# Patient Record
Sex: Female | Born: 1960 | Race: Black or African American | Hispanic: No | Marital: Single | Smoking: Never smoker
Health system: Southern US, Community
[De-identification: ages and names within clinical notes are randomized; demographics above are authoritative.]

## PROBLEM LIST (undated history)

## (undated) HISTORY — PX: TONSILLECTOMY: SUR1361

---

## 2012-03-08 ENCOUNTER — Other Ambulatory Visit: Payer: Self-pay | Admitting: Internal Medicine

## 2012-03-08 DIAGNOSIS — Z1231 Encounter for screening mammogram for malignant neoplasm of breast: Secondary | ICD-10-CM

## 2012-03-28 ENCOUNTER — Ambulatory Visit: Payer: Self-pay

## 2012-04-11 ENCOUNTER — Ambulatory Visit
Admission: RE | Admit: 2012-04-11 | Discharge: 2012-04-11 | Disposition: A | Discharge status: Home / Self Care | Payer: Medicaid Other | Source: Ambulatory Visit | Attending: Internal Medicine | Admitting: Internal Medicine

## 2012-04-11 DIAGNOSIS — Z1231 Encounter for screening mammogram for malignant neoplasm of breast: Secondary | ICD-10-CM

## 2014-10-07 ENCOUNTER — Ambulatory Visit: Payer: Self-pay | Admitting: Podiatry

## 2018-11-14 ENCOUNTER — Encounter (HOSPITAL_COMMUNITY): Payer: Self-pay | Admitting: Emergency Medicine

## 2018-11-14 ENCOUNTER — Other Ambulatory Visit: Payer: Self-pay

## 2018-11-14 ENCOUNTER — Emergency Department (HOSPITAL_COMMUNITY)
Admission: EM | Admit: 2018-11-14 | Discharge: 2018-11-14 | Disposition: A | Discharge status: Home / Self Care | Payer: Medicaid Other | Attending: Emergency Medicine | Admitting: Emergency Medicine

## 2018-11-14 ENCOUNTER — Emergency Department (HOSPITAL_COMMUNITY): Payer: Medicaid Other

## 2018-11-14 DIAGNOSIS — Y9389 Activity, other specified: Secondary | ICD-10-CM | POA: Insufficient documentation

## 2018-11-14 DIAGNOSIS — S022XXA Fracture of nasal bones, initial encounter for closed fracture: Secondary | ICD-10-CM | POA: Insufficient documentation

## 2018-11-14 DIAGNOSIS — Y998 Other external cause status: Secondary | ICD-10-CM | POA: Insufficient documentation

## 2018-11-14 DIAGNOSIS — S0121XA Laceration without foreign body of nose, initial encounter: Secondary | ICD-10-CM | POA: Insufficient documentation

## 2018-11-14 DIAGNOSIS — S0990XA Unspecified injury of head, initial encounter: Secondary | ICD-10-CM | POA: Diagnosis present

## 2018-11-14 DIAGNOSIS — R040 Epistaxis: Secondary | ICD-10-CM

## 2018-11-14 DIAGNOSIS — Y92410 Unspecified street and highway as the place of occurrence of the external cause: Secondary | ICD-10-CM | POA: Diagnosis not present

## 2018-11-14 DIAGNOSIS — S0083XA Contusion of other part of head, initial encounter: Secondary | ICD-10-CM | POA: Diagnosis not present

## 2018-11-14 MED ORDER — OXYMETAZOLINE HCL 0.05 % NA SOLN
1.0000 | Freq: Once | NASAL | Status: AC
  Administered 2018-11-14: 1 via NASAL
  Filled 2018-11-14: qty 30

## 2018-11-14 MED ORDER — AMOXICILLIN-POT CLAVULANATE 875-125 MG PO TABS: 1.0000 | ORAL_TABLET | Freq: Two times a day (BID) | ORAL | 0 refills | Status: DC

## 2018-11-14 MED ORDER — LIDOCAINE-EPINEPHRINE-TETRACAINE (LET) SOLUTION
3.0000 mL | Freq: Once | NASAL | Status: AC
  Administered 2018-11-14: 3 mL via TOPICAL
  Filled 2018-11-14: qty 3
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
## 2018-11-14 NOTE — ED Triage Notes (Signed)
Per [employer] transport company was getting a hold of patient's caregiver. Either they or [employer] can transport pt home.
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
## 2018-11-14 NOTE — Discharge Instructions (Addendum)
Use the nasal spray one spray in each nostril once in the morning and once at night for the next 3 days. Follow up with your doctor and follow up with the ENT doctor, Dr. Jenne Pane. Take tylenol and motrin as needed for pain.
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
## 2021-10-16 ENCOUNTER — Ambulatory Visit (HOSPITAL_COMMUNITY)
Admission: EM | Admit: 2021-10-16 | Discharge: 2021-10-16 | Disposition: A | Discharge status: Home / Self Care | Payer: Medicaid Other

## 2021-10-21 ENCOUNTER — Encounter (HOSPITAL_COMMUNITY): Payer: Self-pay | Admitting: Physician Assistant

## 2021-10-21 ENCOUNTER — Telehealth (HOSPITAL_COMMUNITY): Payer: Self-pay | Admitting: *Deleted

## 2021-10-21 ENCOUNTER — Other Ambulatory Visit: Payer: Self-pay

## 2021-10-21 ENCOUNTER — Ambulatory Visit (INDEPENDENT_AMBULATORY_CARE_PROVIDER_SITE_OTHER): Payer: Medicaid Other | Admitting: Physician Assistant

## 2021-10-21 VITALS — BP 138/89 | HR 73 | Wt 147.0 lb

## 2021-10-21 DIAGNOSIS — F209 Schizophrenia, unspecified: Secondary | ICD-10-CM | POA: Diagnosis not present

## 2021-10-21 MED ORDER — ARIPIPRAZOLE 5 MG PO TABS: 5.0000 mg | ORAL_TABLET | Freq: Every day | ORAL | 0 refills | Status: DC

## 2021-10-21 NOTE — Progress Notes (Signed)
Psychiatric Initial Adult Assessment   Patient Identification: Mayley Lish MRN:  [id number] Date of Evaluation:  10/21/2021 Referral Source: [facility] Chief Complaint:   Chief Complaint  Patient presents with   WALK-IN    *wlk-in* new pt [facility] only for med mgmt   Visit Diagnosis:    ICD-10-CM   1. Schizophrenia, unspecified type (HCC)  F20.9 ARIPiprazole (ABILIFY) 5 MG tablet      History of Present Illness:    Leialoha Hanna is a 61 year old female with a past psychiatric history significant for schizophrenia (unspecified type) who presents to [facility] as a walk-in for medication management.  Patient reports that she was referred to our facility through [employer], a mental health and substance abuse treatment service for adult men and women in a short-term residential setting.  Patient presents to the clinic requesting to receive her Abilify Maintena injection.  Patient reports that she received her last injection at [facility].  Prior to presenting today, patient states that she was affiliated with the [facility] for roughly 9 years before the facility closed down.  Patient has been receiving her injection for over a year and states that she receives 300 mg.  Patient reports that her Abilify Maintena injection has been helpful with the management of her symptoms.  Patient endorses anxiety and rates her anxiety a 9 out of 10.  Patient's main stressor is being unable to receive her Abilify Maintena injection.  She reports that she has been without the injection since January.  Prior to receiving her Abilify Maintena injection, patient states that she was taking Risperdal for the management of her symptoms related to schizophrenia.  Patient does not have any documents stating the date in which she received her Abilify Maintena injection.  Patient endorses the following depressive symptoms: fatigue,  lack of motivation, and decreased concentration.  Patient denies feelings of sadness, hopelessness, worthlessness, or feelings of guilt.  Patient further denies bizarre behaviors.  Patient denies a past history of hospitalization due to mental health.  She denies past history of suicide attempt nor has she engaged in self-harm.  A PHQ-9 screen was performed with the patient scoring a 9.  A GAD-7 screen was also performed with the patient scoring a 7.  Patient is alert and oriented x4, calm, cooperative, and engaged in conversation during the encounter.  Patient denies suicidal or homicidal ideations.  She further denies auditory sensations; however, she does report experiencing visual hallucinations characterized by seeing snakes.  Patient endorses good sleep and receives on average 6 to 7 hours of sleep each night.  Patient endorses decreased appetite but states that she eats at least 1 meal per day.  Patient denies alcohol consumption, tobacco use, and illicit drug use.  Associated Signs/Symptoms: Depression Symptoms:  depressed mood, psychomotor agitation, psychomotor retardation, fatigue, difficulty concentrating, impaired memory, anxiety, loss of energy/fatigue, disturbed sleep, weight loss, (Hypo) Manic Symptoms:  Distractibility, Financial Extravagance, Hallucinations, Impulsivity, Anxiety Symptoms:  Excessive Worry, Psychotic Symptoms:  Hallucinations: Visual Paranoia, PTSD Symptoms: Had a traumatic exposure:  Patient denies history of trauma Had a traumatic exposure in the last month:  N/A Re-experiencing:  None Hypervigilance:  Yes Hyperarousal:  Irritability/Anger Avoidance:  Foreshortened Future  Past Psychiatric History:  Schizophrenia  Previous Psychotropic Medications: Yes   Substance Abuse History in the last 12 months:  No.  Consequences of Substance Abuse: Negative  Past Medical History: History reviewed. No pertinent past medical history. History reviewed.  No pertinent surgical history.  Family Psychiatric History:  Patient denies a family history of psychiatric illness  Family History: History reviewed. No pertinent family history.  Social History:   Social History   Socioeconomic History   Marital status: Single    Spouse name: Not on file   Number of children: Not on file   Years of education: Not on file   Highest education level: Not on file  Occupational History   Not on file  Tobacco Use   Smoking status: Never   Smokeless tobacco: Never  Vaping Use   Vaping Use: Never used  Substance and Sexual Activity   Alcohol use: Not on file   Drug use: Not on file   Sexual activity: Not on file  Other Topics Concern   Not on file  Social History Narrative   Not on file   Social Determinants of Health   Financial Resource Strain: Not on file  Food Insecurity: Not on file  Transportation Needs: Not on file  Physical Activity: Not on file  Stress: Not on file  Social Connections: Not on file    Additional Social History:  Patient is currently unemployed but does endorse housing.  Patient also reports that she has transportation.  Patient endorses social support through family.  Patient is affiliated with [employer]  Allergies:  No Known Allergies  Metabolic Disorder Labs: No results found for: HGBA1C, MPG No results found for: PROLACTIN No results found for: CHOL, TRIG, HDL, CHOLHDL, VLDL, LDLCALC No results found for: TSH  Therapeutic Level Labs: No results found for: LITHIUM No results found for: CBMZ No results found for: VALPROATE  Current Medications: Current Outpatient Medications  Medication Sig Dispense Refill   ARIPiprazole (ABILIFY) 5 MG tablet Take 1 tablet (5 mg total) by mouth daily. 14 tablet 0   amoxicillin-clavulanate (AUGMENTIN) 875-125 MG tablet Take 1 tablet by mouth every 12 (twelve) hours. 14 tablet 0   No current facility-administered medications for this visit.     Musculoskeletal: Strength & Muscle Tone: within normal limits Gait & Station: normal Patient leans: N/A  Psychiatric Specialty Exam: Review of Systems  Psychiatric/Behavioral:  Positive for behavioral problems. Negative for decreased concentration, dysphoric mood, hallucinations, self-injury, sleep disturbance and suicidal ideas. The patient is nervous/anxious. The patient is not hyperactive.    Blood pressure 138/89, pulse 73, weight 147 lb (66.7 kg).There is no height or weight on file to calculate BMI.  General Appearance: Fairly Groomed  Eye Contact:  Good  Speech:  Clear and Coherent and Normal Rate  Volume:  Normal  Mood:  Anxious and Depressed  Affect:  Congruent  Thought Process:  Coherent, Goal Directed, and Descriptions of Associations: Intact  Orientation:  Full (Time, Place, and Person)  Thought Content:  WDL and Obsessions  Suicidal Thoughts:  No  Homicidal Thoughts:  No  Memory:  Immediate;   Good Recent;   Fair Remote;   Fair  Judgement:  Impaired  Insight:  Present  Psychomotor Activity:  Normal  Concentration:  Concentration: Good and Attention Span: Good  Recall:  Fair  Fund of Knowledge:Fair  Language: Good  Akathisia:  No  Handed:  Right  AIMS (if indicated):  not done  Assets:  Communication Skills Desire for Improvement Housing Social Support  ADL's:  Intact  Cognition: WNL  Sleep:  Good   Screenings: GAD-7    Flowsheet Row Office Visit from 10/21/2021 in [facility]  Total GAD-7 Score 7  PHQ2-9    Flowsheet Row Office Visit from 10/21/2021 in [facility]  PHQ-2 Total Score 3  PHQ-9 Total Score 9      Flowsheet Row Office Visit from 10/21/2021 in [facility]  C-SSRS RISK CATEGORY No Risk       Assessment and Plan:   Dorothee Napierkowski is a 61 year old female with a past psychiatric history significant for schizophrenia (unspecified type) who  presents to [facility] as a walk-in for medication management.  Went to the clinic requesting to receive her Abilify Maintena 300 mg injection.  She reports that she last received this injection in January at [facility] but she does not have any documentation supporting this claim.  Due to no documents available, patient to be placed on a short course of Abilify 5 mg daily before she is able to receive her Abilify Maintena injection.  Provider to reach out to [facility] to determine when patient received her last injection.  Patient's medication to be e-prescribed to pharmacy of choice.  Collaboration of Care: Medication Management AEB provider is managing patient's psychiatric medication and Psychiatrist AEB patient is being seen by a psychiatric provider at this facility  Patient/Guardian was advised Release of Information must be obtained prior to any record release in order to collaborate their care with an outside provider. Patient/Guardian was advised if they have not already done so to contact the registration department to sign all necessary forms in order for [place] to release information regarding their care.   Consent: Patient/Guardian gives verbal consent for treatment and assignment of benefits for services provided during this visit. Patient/Guardian expressed understanding and agreed to proceed.   1. Schizophrenia, unspecified type [facility]) Patient reports that she was taking Abilify Maintena 300 mg injection and states that she had her last injection back in January 17th, 2022.  Due to not having any evidence of past injection, patient to take 2 weeks of Abilify 5 mg daily before receiving new injection.  Provider to reach out to [facility] to receive confirmation of patient's past history of Abilify injection  - ARIPiprazole (ABILIFY) 5 MG tablet; Take 1 tablet (5 mg total) by mouth daily.  Dispense: 14 tablet;  Refill: 0  Patient to follow up in 2 weeks Provider spent a total of 60+ minutes with the patient/reviewing patient's chart  Meta Hatchet, PA 2/22/20235:39 PM

## 2021-10-21 NOTE — Telephone Encounter (Signed)
Called to say she spoke with [facility] and she had her last Abilify M inj on 09/15/21. Said she was at the pharmacy this am and her medicine didn't get called in. Checked the chart and her oral abilify got called in today at 1115 and informed her of this, she however is very focused on a shot and wants me to call Trinity or Eddie to call, do a release of info and and let Link Snuffer know about her shot date. I told her I would inform him.

## 2021-11-04 ENCOUNTER — Encounter (HOSPITAL_COMMUNITY): Payer: Self-pay | Admitting: Physician Assistant

## 2021-11-04 ENCOUNTER — Other Ambulatory Visit: Payer: Self-pay

## 2021-11-04 ENCOUNTER — Telehealth (HOSPITAL_COMMUNITY): Payer: Self-pay | Admitting: *Deleted

## 2021-11-04 ENCOUNTER — Telehealth (HOSPITAL_COMMUNITY): Payer: Self-pay

## 2021-11-04 ENCOUNTER — Ambulatory Visit (INDEPENDENT_AMBULATORY_CARE_PROVIDER_SITE_OTHER): Payer: Medicaid Other | Admitting: Physician Assistant

## 2021-11-04 VITALS — BP 154/76 | HR 69 | Ht 66.0 in | Wt 145.0 lb

## 2021-11-04 DIAGNOSIS — F209 Schizophrenia, unspecified: Secondary | ICD-10-CM

## 2021-11-04 MED ORDER — ABILIFY MAINTENA 300 MG IM SRER: 300.0000 mg | Freq: Once | INTRAMUSCULAR | 0 refills | Status: DC

## 2021-11-04 NOTE — Progress Notes (Cosign Needed)
BH MD/PA/NP OP Progress Note  11/04/2021 6:54 PM Carol Russell  MRN:  [id number]  Chief Complaint:  Chief Complaint  Patient presents with   Medication Management    in person, f/u mm   HPI:   Carol Russell  Visit Diagnosis:    ICD-10-CM   1. Schizophrenia, unspecified type ([place])  F20.9 ARIPiprazole ER (ABILIFY MAINTENA) 300 MG SRER injection      Past Psychiatric History:  Schizophrenia, unspecified type  Past Medical History: History reviewed. No pertinent past medical history. History reviewed. No pertinent surgical history.  Family Psychiatric History:  Patient denies a family history of psychiatric illness  Family History: History reviewed. No pertinent family history.  Social History:  Social History   Socioeconomic History   Marital status: Single    Spouse name: Not on file   Number of children: Not on file   Years of education: Not on file   Highest education level: Not on file  Occupational History   Not on file  Tobacco Use   Smoking status: Never   Smokeless tobacco: Never  Vaping Use   Vaping Use: Never used  Substance and Sexual Activity   Alcohol use: Not on file   Drug use: Not on file   Sexual activity: Not on file  Other Topics Concern   Not on file  Social History Narrative   Not on file   Social Determinants of Health   Financial Resource Strain: Not on file  Food Insecurity: Not on file  Transportation Needs: Not on file  Physical Activity: Not on file  Stress: Not on file  Social Connections: Not on file    Allergies: No Known Allergies  Metabolic Disorder Labs: No results found for: HGBA1C, MPG No results found for: PROLACTIN No results found for: CHOL, TRIG, HDL, CHOLHDL, VLDL, LDLCALC No results found for: TSH  Therapeutic Level Labs: No results found for: LITHIUM No results found for: VALPROATE No components found for:  CBMZ  Current Medications: Current Outpatient Medications  Medication Sig Dispense Refill    amoxicillin-clavulanate (AUGMENTIN) 875-125 MG tablet Take 1 tablet by mouth every 12 (twelve) hours. 14 tablet 0   ARIPiprazole (ABILIFY) 5 MG tablet Take 1 tablet (5 mg total) by mouth daily. 14 tablet 0   ARIPiprazole ER (ABILIFY MAINTENA) 300 MG SRER injection Inject 1.5 mLs (300 mg total) into the muscle once for 1 dose. 1 each 0   No current facility-administered medications for this visit.     Musculoskeletal: Strength & Muscle Tone: within normal limits Gait & Station: normal Patient leans: N/A  Psychiatric Specialty Exam: Review of Systems  Psychiatric/Behavioral:  Positive for behavioral problems and sleep disturbance. Negative for decreased concentration, dysphoric mood, hallucinations, self-injury and suicidal ideas. The patient is nervous/anxious. The patient is not hyperactive.    Blood pressure (!) 154/76, pulse 69, height 5\' 6"  (1.676 m), weight 145 lb (65.8 kg).Body mass index is 23.4 kg/m.  General Appearance: Fairly Groomed  Eye Contact:  Good  Speech:  Clear and Coherent  Volume:  Normal  Mood:  Anxious and Euthymic  Affect:  Congruent  Thought Process:  Coherent, Goal Directed, and Descriptions of Associations: Intact  Orientation:  Full (Time, Place, and Person)  Thought Content: WDL and Obsessions   Suicidal Thoughts:  No  Homicidal Thoughts:  No  Memory:  Immediate;   Good Recent;   Fair Remote;   Fair  Judgement:  Impaired  Insight:  Present  Psychomotor Activity:  Normal  Concentration:  Concentration: Good and Attention Span: Good  Recall:  [employer] of Knowledge: Fair  Language: Good  Akathisia:  No  Handed:  Right  AIMS (if indicated): not done  Assets:  Communication Skills Desire for Improvement Housing Social Support  ADL's:  Intact  Cognition: WNL  Sleep:  Fair   Screenings: GAD-7    Flowsheet Row Clinical Support from 11/04/2021 in [facility] Office Visit from 10/21/2021 in [facility]  Total GAD-7 Score 8 7      PHQ2-9    Flowsheet Row Clinical Support from 11/04/2021 in [facility] Office Visit from 10/21/2021 in [facility]  PHQ-2 Total Score 2 3  PHQ-9 Total Score 9 9      Flowsheet Row Clinical Support from 11/04/2021 in [facility] Office Visit from 10/21/2021 in [place] No Risk No Risk        Assessment and Plan:     Collaboration of Care: Collaboration of Care: Medication Management AEB provider managing patient's psychiatric medications and Psychiatrist AEB patient being followed by a mental health provider at this clinic  Patient/Guardian was advised Release of Information must be obtained prior to any record release in order to collaborate their care with an outside provider. Patient/Guardian was advised if they have not already done so to contact the registration department to sign all necessary forms in order for [place] to release information regarding their care.   Consent: Patient/Guardian gives verbal consent for treatment and assignment of benefits for services provided during this visit. Patient/Guardian expressed understanding and agreed to proceed.   1. Schizophrenia, unspecified type ([place])  - ARIPiprazole ER (ABILIFY MAINTENA) 300 MG SRER injection; Inject 1.5 mLs (300 mg total) into the muscle once for 1 dose.  Dispense: 1 each; Refill: 0  Provider spent a total of 35 minutes with the patient/reviewing patient's chart Patient to follow up on 11/10/2021 to receive her Abilify Maintena 300 mg injection. Patient to bring LAI to clinic on her appointment date  Malachy Mood, [place] 11/04/2021, 6:54 PM

## 2021-11-04 NOTE — Telephone Encounter (Signed)
Medication management - Attempted to call back Vilinda Blanks, group home owner for patient and left a generic message this nurse was calling her back and would be glad to speak to her.  No current consent so nothing to identify patient acknowledgement left ?

## 2021-11-04 NOTE — Telephone Encounter (Signed)
In for an appt to follow up with [facility]. She is accompanied by a case Production designer, theatre/television/film. Eddie would like her to have a shot of Invega but her dose is 300 mg and we dont stock it. She has MCD so she should pick up her own shot and bring it to clinic to get her injection. She prefers to use [facility] as they deliver but [facility] has it in stock now. However, case manager says she has another appt and cant take her to [facility] for meds and bring her back today for her shot. Eddie PA is to call injection to [facility], it will be delievered to her and she will bring it here on Tues the 14 th and we will give her the shot. Going forward she is to return to [facility]. This was explained to case manager and to patient and written down for her. Shortly after they left [facility] with [employer] called me not understanding what transpired at her appt and what the plan was going forward. Explained I called [facility], she has a current CCA and they do have a shot clinic for LAI and they did recommend at her CCA appt she have medication management services. [facility] does a PSR program she attends and Maxine Glenn agrees she is already in the [facility] system and they can help coordinate her shot, they sent her here because they could not get her seen by a provider at [facility] before her shot was due. She thanked Clinical research associate for the help and agreeing to do a one time bridge of her medicine tues which is the next shot clinic day to give her time to get her shot delievered and to have her case manager make arrangements for [facility] transport to bring her to the appt. Maxine Glenn will coordinate her services going forward to continue her shots and medication services with [facility] in [place].  ?

## 2021-11-09 ENCOUNTER — Ambulatory Visit (HOSPITAL_COMMUNITY): Payer: Medicaid Other

## 2021-11-10 ENCOUNTER — Ambulatory Visit (HOSPITAL_COMMUNITY): Payer: Medicaid Other | Admitting: *Deleted

## 2021-11-10 ENCOUNTER — Encounter (HOSPITAL_COMMUNITY): Payer: Self-pay

## 2021-11-10 ENCOUNTER — Other Ambulatory Visit: Payer: Self-pay

## 2021-11-10 VITALS — BP 129/77 | HR 78 | Ht 66.0 in | Wt 146.0 lb

## 2021-11-10 DIAGNOSIS — F209 Schizophrenia, unspecified: Secondary | ICD-10-CM

## 2021-11-10 MED ORDER — ARIPIPRAZOLE ER 300 MG IM PRSY
300.0000 mg | PREFILLED_SYRINGE | INTRAMUSCULAR | Status: DC
  Administered 2021-11-10 – 2021-12-10 (×2): 300 mg via INTRAMUSCULAR

## 2021-11-10 NOTE — Progress Notes (Signed)
Patient arrived for injection ABILIFY MAINTENA 300 MG . Tolerated injection well in Right-Arm ?

## 2021-11-12 NOTE — Telephone Encounter (Signed)
Patient has been resorbed into this clinic's shot clinic. Patient has a follow up appointment scheduled for 12/10/2021.

## 2021-11-18 ENCOUNTER — Telehealth (HOSPITAL_COMMUNITY): Payer: Self-pay | Admitting: *Deleted

## 2021-11-18 NOTE — Telephone Encounter (Signed)
Call from patient requesting I call her pharmacy so they will deliver her medicine to her. Explained her shot is not due till 12/10/21 and no need for shot to be delivered to her. Explained the provider was not available today, will speak with him re it tomorrow but too early for delivery as shot is not due for three more weeks. She again, repeated would I call her pharmacy so they would deliver her medicine. Will address it with [facility] tomorrow, but no need for it to be delivered this far in advance.  ?

## 2021-11-30 NOTE — Telephone Encounter (Signed)
ABILIFY 300 MG for injection scheduled for 12/11/21. Pt asking if the prescription will be called in so the pharmacy will have time to deliver it to her home before injection appointment.

## 2021-12-01 ENCOUNTER — Telehealth (HOSPITAL_COMMUNITY): Payer: Self-pay | Admitting: Family

## 2021-12-01 NOTE — Telephone Encounter (Signed)
[facility] will deliver to patient.  Patient calling again this morning to make sure Abilify 300 MG is called in so she can get her shot on time. Please call as soon as it is done. [phone]

## 2021-12-01 NOTE — Telephone Encounter (Cosign Needed)
Patient requests refill of Abilify Maintena 300mg  monthly IM. Refills 11. Medication called in to [facility] by this [facility]. Patient notified by clinic RN. ?

## 2021-12-10 ENCOUNTER — Ambulatory Visit (INDEPENDENT_AMBULATORY_CARE_PROVIDER_SITE_OTHER): Payer: Medicaid Other | Admitting: *Deleted

## 2021-12-10 ENCOUNTER — Encounter (HOSPITAL_COMMUNITY): Payer: Self-pay

## 2021-12-10 VITALS — BP 120/71 | HR 60 | Ht 66.0 in | Wt 143.0 lb

## 2021-12-10 DIAGNOSIS — F209 Schizophrenia, unspecified: Secondary | ICD-10-CM | POA: Diagnosis not present

## 2021-12-10 NOTE — Progress Notes (Signed)
PATIENT & [place] WORKER ARRIVED FOR  ?ARIPiprazole ER (ABILIFY MAINTENA) 300 MG ?TOLERATED INJECTION WELL IN LEFT-ARM & PLEASANT AS ALWAYS ?

## 2022-01-07 ENCOUNTER — Ambulatory Visit (HOSPITAL_COMMUNITY): Payer: Medicaid Other

## 2022-01-07 ENCOUNTER — Encounter (HOSPITAL_COMMUNITY): Payer: Self-pay

## 2022-01-07 VITALS — BP 127/71 | HR 61 | Temp 97.9°F | Ht 62.75 in | Wt 141.0 lb

## 2022-01-07 DIAGNOSIS — F209 Schizophrenia, unspecified: Secondary | ICD-10-CM

## 2022-01-07 MED ORDER — ARIPIPRAZOLE ER 300 MG IM PRSY: 300.0000 mg | PREFILLED_SYRINGE | INTRAMUSCULAR | 11 refills | Status: DC

## 2022-01-07 MED ORDER — ARIPIPRAZOLE ER 300 MG IM PRSY
300.0000 mg | PREFILLED_SYRINGE | Freq: Once | INTRAMUSCULAR | Status: AC
  Administered 2022-01-07: 300 mg via INTRAMUSCULAR

## 2022-01-07 NOTE — Progress Notes (Signed)
Patient presented with appropriate affect, level mood and denied any auditory or visual hallucinations, no suicidal or homicidal ideations, and no plans or intent to want to harm self or others. Patient reported no current problems with current medication regimen and stated mood has been good since last visit.  Patient's due Abilify Maintena 300 mg IM injection prepared as ordered and given to patient in her right deltoid area following new order from [employer], NP this date.  Patient tolerated due injection without any complaint of pain or discomfort and agreed to return for next injection in 4 weeks.  ?

## 2022-02-04 ENCOUNTER — Ambulatory Visit (INDEPENDENT_AMBULATORY_CARE_PROVIDER_SITE_OTHER): Payer: Medicaid Other | Admitting: *Deleted

## 2022-02-04 ENCOUNTER — Encounter (HOSPITAL_COMMUNITY): Payer: Self-pay

## 2022-02-04 VITALS — BP 109/76 | HR 69 | Ht 62.0 in | Wt 144.0 lb

## 2022-02-04 DIAGNOSIS — F209 Schizophrenia, unspecified: Secondary | ICD-10-CM

## 2022-02-04 NOTE — Progress Notes (Signed)
Patient arrived for her  (ABILIFY MAINTENA) 300 MG Injection   into the muscle every 28 (twenty-eight) days   Patient tolerated injection well in  Left Arm, Pleasant as Always  NO HI/SI NOR AH/VH

## 2022-03-04 ENCOUNTER — Ambulatory Visit (INDEPENDENT_AMBULATORY_CARE_PROVIDER_SITE_OTHER): Payer: Medicaid Other | Admitting: *Deleted

## 2022-03-04 ENCOUNTER — Encounter (HOSPITAL_COMMUNITY): Payer: Self-pay

## 2022-03-04 VITALS — BP 104/64 | HR 58 | Ht 62.0 in | Wt 140.0 lb

## 2022-03-04 DIAGNOSIS — F209 Schizophrenia, unspecified: Secondary | ICD-10-CM

## 2022-03-04 MED ORDER — ARIPIPRAZOLE ER 300 MG IM PRSY
300.0000 mg | PREFILLED_SYRINGE | INTRAMUSCULAR | Status: DC
  Administered 2022-03-04 – 2022-08-19 (×6): 300 mg via INTRAMUSCULAR

## 2022-03-04 NOTE — Progress Notes (Signed)
Patient arrived for monthly injection: ARIPiprazole ER (ABILIFY MAINTENA) 300 MG  Tolerated injection well in Right Arm, Pleasant as Always.  NO SI/HI NOR VH/AH

## 2022-04-01 ENCOUNTER — Encounter (HOSPITAL_COMMUNITY): Payer: Self-pay

## 2022-04-01 ENCOUNTER — Ambulatory Visit (HOSPITAL_COMMUNITY): Payer: Medicaid Other

## 2022-04-01 ENCOUNTER — Ambulatory Visit (INDEPENDENT_AMBULATORY_CARE_PROVIDER_SITE_OTHER): Payer: Medicaid Other | Admitting: Student in an Organized Health Care Education/Training Program

## 2022-04-01 VITALS — BP 127/58 | HR 69 | Ht 62.0 in | Wt 149.0 lb

## 2022-04-01 DIAGNOSIS — F209 Schizophrenia, unspecified: Secondary | ICD-10-CM | POA: Diagnosis not present

## 2022-04-01 NOTE — Progress Notes (Signed)
BH MD/PA/NP OP Progress Note  04/01/2022 3:01 PM Carol Russell  MRN:  [id number]  Chief Complaint:  Chief Complaint  Patient presents with   Schizophrenia   HPI:   Patient is Aox4. Patient reports she is in a good mood and happy to be alive. Patient reports she is able to take care of her ADLs. Patient reports she lives alone but her payee helps her with her bills. Patient reports that a [facility] worker brought her here today.  Patient reports denies SI, HI, an AVH. Patient denies belief in special in powers, but does endorse some paranoia that "someone" may be out to get her. Patient reports that when she does not get her shot, she starts to feel "nervous. "  Patient reports that she believes she is sleeping well and feels well rested in the morning. Patient reports that her appetite has been good. Patient denies akathisia.   Patient reports she likes to visit people and go shopping. Patient reports she is able to get her groceries and will neighbors every once in a while. Patient reports she has friends at her facility.   4 quarters in $1 11 quarters in : $2.75 5/5 presidents, does have some reverbation on some names   Visit Diagnosis:    ICD-10-CM   1. Schizophrenia, unspecified type ([facility])  F20.9       Past Psychiatric History: Schizophrenia, unspecified type  Past Medical History: History reviewed. No pertinent past medical history. History reviewed. No pertinent surgical history.  Family Psychiatric History: Patient denies a family history of psychiatric illness  Family History: History reviewed. No pertinent family history.  Social History:  Social History   Socioeconomic History   Marital status: Single    Spouse name: Not on file   Number of children: Not on file   Years of education: Not on file   Highest education level: Not on file  Occupational History   Not on file  Tobacco Use   Smoking status: Never   Smokeless tobacco: Never  Vaping Use   Vaping Use: Never  used  Substance and Sexual Activity   Alcohol use: Not Currently   Drug use: Not Currently   Sexual activity: Not Currently  Other Topics Concern   Not on file  Social History Narrative   Not on file   Social Determinants of Health   Financial Resource Strain: Not on file  Food Insecurity: Not on file  Transportation Needs: Not on file  Physical Activity: Not on file  Stress: Not on file  Social Connections: Not on file    Allergies: No Known Allergies  Metabolic Disorder Labs: No results found for: "HGBA1C", "MPG" No results found for: "PROLACTIN" No results found for: "CHOL", "TRIG", "HDL", "CHOLHDL", "VLDL", "LDLCALC" No results found for: "TSH"  Therapeutic Level Labs: No results found for: "LITHIUM" No results found for: "VALPROATE" No results found for: "CBMZ"  Current Medications: Current Outpatient Medications  Medication Sig Dispense Refill   amoxicillin-clavulanate (AUGMENTIN) 875-125 MG tablet Take 1 tablet by mouth every 12 (twelve) hours. 14 tablet 0   ARIPiprazole (ABILIFY) 5 MG tablet Take 1 tablet (5 mg total) by mouth daily. 14 tablet 0   ARIPiprazole ER (ABILIFY MAINTENA) 300 MG PRSY prefilled syringe Inject 300 mg into the muscle every 28 (twenty-eight) days. 1 each 11   Current Facility-Administered Medications  Medication Dose Route Frequency Provider Last Rate Last Admin   ARIPiprazole ER (ABILIFY MAINTENA) 300 MG prefilled syringe 300 mg  300 mg  Intramuscular Q28 days Rankin, Shuvon B, NP   300 mg at 04/01/22 1403     Musculoskeletal: Strength & Muscle Tone: increased Gait & Station: normal Patient leans: Front  Psychiatric Specialty Exam: Review of Systems  Psychiatric/Behavioral:  Negative for hallucinations and suicidal ideas.     Blood pressure (!) 127/58, pulse 69, height 5\' 2"  (1.575 m), weight 149 lb (67.6 kg), SpO2 100 %.Body mass index is 27.25 kg/m.  General Appearance: Casual  Eye Contact:  Good  Speech:  Clear and Coherent   Volume:  Normal  Mood:  Euthymic  Affect:  Congruent  Thought Process:  Goal Directed  Orientation:  Full (Time, Place, and Person)  Thought Content:  Concrete    Suicidal Thoughts:  No  Homicidal Thoughts:  No  Memory:  Immediate;   Poor  Judgement:  Fair  Insight:  Shallow  Psychomotor Activity:  Decreased  Concentration:  Concentration: Good  Recall:  Good  Fund of Knowledge: Fair  Language: Good  Akathisia:  No    AIMS (if indicated): done  Assets:  Resilience Social Support  ADL's:  Intact  Cognition: Impaired,  Moderate  Sleep:  Fair   Screenings: GAD-7    Flowsheet Row Clinical Support from 04/01/2022 in [facility] Clinical Support from 11/04/2021 in [facility] Office Visit from 10/21/2021 in [facility]  Total GAD-7 Score 4 8 7       PHQ2-9    Flowsheet Row Clinical Support from 04/01/2022 in [facility] Clinical Support from 11/04/2021 in [facility] Office Visit from 10/21/2021 in [facility]  PHQ-2 Total Score 1 2 3   PHQ-9 Total Score -- 9 9      Flowsheet Row Clinical Support from 04/01/2022 in [facility] Clinical Support from 11/04/2021 in [facility] Office Visit from 10/21/2021 in [facility]  C-SSRS RISK CATEGORY No Risk No Risk No Risk        Assessment and Plan: Carol Russell is a 61 yo patient with a hx of schizophrenia. Patient endorses a college level education although she denies graduation. Patient was able to do basic calc and recall last 5 president's in order. Patient does display difficult ability to comprehend certain questions noticed during the GAD-7 and PHQ-2. Patient's limited cognitive function for her age is likely related to her schizophrenia diagnosis.   Schizophrenia - Continue  Abilify Maintena 300mg    Collaboration of Care: Collaboration of Care:   Patient/Guardian was advised Release of Information must be obtained prior to any record release in order to collaborate their care with an outside provider. Patient/Guardian was advised if they have not already done so to contact the registration department to sign all necessary forms in order for [facility] to release information regarding their care.   Consent: Patient/Guardian gives verbal consent for treatment and assignment of benefits for services provided during this visit. Patient/Guardian expressed understanding and agreed to proceed.   PGY-3 [name], MD 04/01/2022, 3:01 PM

## 2022-04-29 ENCOUNTER — Other Ambulatory Visit (HOSPITAL_COMMUNITY): Payer: Self-pay | Admitting: Registered Nurse

## 2022-04-29 ENCOUNTER — Encounter (HOSPITAL_COMMUNITY): Payer: Self-pay

## 2022-04-29 ENCOUNTER — Ambulatory Visit (INDEPENDENT_AMBULATORY_CARE_PROVIDER_SITE_OTHER): Payer: Medicaid Other

## 2022-04-29 VITALS — BP 160/83 | HR 64 | Ht 62.0 in | Wt 141.0 lb

## 2022-04-29 DIAGNOSIS — F209 Schizophrenia, unspecified: Secondary | ICD-10-CM

## 2022-04-29 MED ORDER — ARIPIPRAZOLE ER 300 MG IM PRSY: 300.0000 mg | PREFILLED_SYRINGE | INTRAMUSCULAR | 2 refills | Status: DC

## 2022-05-27 ENCOUNTER — Ambulatory Visit (INDEPENDENT_AMBULATORY_CARE_PROVIDER_SITE_OTHER): Payer: Medicaid Other | Admitting: *Deleted

## 2022-05-27 ENCOUNTER — Encounter (HOSPITAL_COMMUNITY): Payer: Self-pay

## 2022-05-27 VITALS — BP 120/92 | HR 85 | Ht 62.0 in | Wt 144.0 lb

## 2022-05-27 DIAGNOSIS — F209 Schizophrenia, unspecified: Secondary | ICD-10-CM

## 2022-05-27 NOTE — Progress Notes (Signed)
Patient arrived today for Injection ARIPiprazole ER (ABILIFY MAINTENA) 300 MG  syringe  Patient pleasant as ALWAYS & Tolerated injection well in Right-Arm

## 2022-06-24 ENCOUNTER — Encounter (HOSPITAL_COMMUNITY): Payer: Self-pay

## 2022-06-24 ENCOUNTER — Ambulatory Visit (INDEPENDENT_AMBULATORY_CARE_PROVIDER_SITE_OTHER): Payer: Medicaid Other | Admitting: *Deleted

## 2022-06-24 VITALS — BP 114/65 | HR 58 | Ht 62.0 in | Wt 145.0 lb

## 2022-06-24 DIAGNOSIS — F209 Schizophrenia, unspecified: Secondary | ICD-10-CM | POA: Diagnosis not present

## 2022-06-24 NOTE — Progress Notes (Signed)
Patient arrived for her Injection - ARIPiprazole ER (ABILIFY MAINTENA) 300 MG EVERY 28 Days. Patient tolerated injection well in Left Arm

## 2022-07-20 ENCOUNTER — Encounter (HOSPITAL_COMMUNITY): Payer: Self-pay

## 2022-07-20 ENCOUNTER — Ambulatory Visit (HOSPITAL_COMMUNITY): Payer: Medicaid Other | Admitting: *Deleted

## 2022-07-20 VITALS — BP 122/79 | HR 69 | Ht 62.0 in | Wt 146.0 lb

## 2022-07-20 DIAGNOSIS — F209 Schizophrenia, unspecified: Secondary | ICD-10-CM

## 2022-07-20 NOTE — Progress Notes (Signed)
Patient arrived for her monthly ARIPiprazole ER (ABILIFY MAINTENA) 300 MG  Patient recognizes me immediately & looks for me to do her checking/rooming. Patient seems to be a little forget yet told me her Thanksgiving meal she's making. Very Pleasant as Always. NO  HI/SI NOR AH /VH .

## 2022-08-19 ENCOUNTER — Encounter (HOSPITAL_COMMUNITY): Payer: Self-pay | Admitting: Registered Nurse

## 2022-08-19 ENCOUNTER — Ambulatory Visit (INDEPENDENT_AMBULATORY_CARE_PROVIDER_SITE_OTHER): Payer: Medicaid Other | Admitting: Registered Nurse

## 2022-08-19 ENCOUNTER — Ambulatory Visit (HOSPITAL_COMMUNITY): Payer: Medicaid Other

## 2022-08-19 ENCOUNTER — Encounter (HOSPITAL_COMMUNITY): Payer: Self-pay

## 2022-08-19 VITALS — BP 128/85 | HR 67 | Ht 62.0 in | Wt 147.8 lb

## 2022-08-19 DIAGNOSIS — F209 Schizophrenia, unspecified: Secondary | ICD-10-CM

## 2022-08-19 MED ORDER — ABILIFY ASIMTUFII 720 MG/2.4ML IM PRSY: 720.0000 mg | PREFILLED_SYRINGE | INTRAMUSCULAR | 2 refills | Status: DC

## 2022-08-19 NOTE — Progress Notes (Signed)
  Patient presents to the office for Abilify Maintena 300 mg injection, given in her left deltoid by Antony Salmon CMA will return in 28 days

## 2022-08-19 NOTE — Progress Notes (Signed)
BH MD/PA/NP OP Progress Note  08/19/2022 2:25 PM Carol Russell  MRN:  [id number]  Chief Complaint:  Chief Complaint  Patient presents with   Follow-up    Psychiatric evaluation and administration of long acting injectable   HPI: Carol Russell 61 y.o. female seen face to face in office today for follow-up psychiatric evaluation and administration of long acting injectable.  She has a psychiatric history significant for schizophrenia.  Her mental health is managed with Abilify Maintena 300 mg monthly.  She reports medication continues to manage her mental health well without adverse reaction but wants to switch over to Abilify Asimtufii (2 month injection).  States she has been doing well with the [facility] and would be more convenient to go to the 2 month injection.  Education on the differences of medications and that once she is started on the injection to make sure she pays attention to her symptoms some do well with 2 month and other with the one month.  Understanding voiced.      Today she denies depression, anxiety, fluctuations in mood, abnormal movement, suicidal/self-harm/homicidal ideation, psychosis, and paranoia.  AIMS, PHQ 2 & 9, C-SSRS, and GAD 7 screenings conducted by provider see scores below. She also reports eating and sleeping without difficulty.   She states that she continues to live alone but has people that are supportive and help her out when needed.    Visit Diagnosis:    ICD-10-CM   1. Schizophrenia, unspecified type (HCC)  F20.9       Past Psychiatric History: Schizophrenia, unspecified type   Past Medical History: History reviewed. No pertinent past medical history. History reviewed. No pertinent surgical history.  Family Psychiatric History: None reported  Family History: History reviewed. No pertinent family history.  Social History:  Social History   Socioeconomic History   Marital status: Single    Spouse name: Not on file   Number of children: Not on  file   Years of education: Not on file   Highest education level: Not on file  Occupational History   Not on file  Tobacco Use   Smoking status: Never   Smokeless tobacco: Never  Vaping Use   Vaping Use: Never used  Substance and Sexual Activity   Alcohol use: Not Currently   Drug use: Not Currently   Sexual activity: Not Currently  Other Topics Concern   Not on file  Social History Narrative   Not on file   Social Determinants of Health   Financial Resource Strain: Not on file  Food Insecurity: Not on file  Transportation Needs: Not on file  Physical Activity: Not on file  Stress: Not on file  Social Connections: Not on file    Allergies: No Known Allergies  Metabolic Disorder Labs: No results found for: "HGBA1C", "MPG" No results found for: "PROLACTIN" No results found for: "CHOL", "TRIG", "HDL", "CHOLHDL", "VLDL", "LDLCALC" No results found for: "TSH"  Therapeutic Level Labs: No results found for: "LITHIUM" No results found for: "VALPROATE" No results found for: "CBMZ"  Current Medications: Current Outpatient Medications  Medication Sig Dispense Refill   ARIPiprazole ER (ABILIFY ASIMTUFII) 720 MG/2.4ML PRSY Inject 720 mg into the muscle every 2 (two) months. 2.4 mL 2   amoxicillin-clavulanate (AUGMENTIN) 875-125 MG tablet Take 1 tablet by mouth every 12 (twelve) hours. 14 tablet 0   Current Facility-Administered Medications  Medication Dose Route Frequency Provider Last Rate Last Admin   ARIPiprazole ER (ABILIFY MAINTENA) 300 MG prefilled syringe 300 mg  300 mg Intramuscular Q28 days Aidan Moten B, NP   300 mg at 08/19/22 1337     Musculoskeletal: Strength & Muscle Tone: within normal limits Gait & Station: normal Patient leans: N/A  Psychiatric Specialty Exam: Review of Systems  Psychiatric/Behavioral:  Negative for agitation, behavioral problems, confusion, dysphoric mood, hallucinations, self-injury and suicidal ideas. The patient is not  nervous/anxious and is not hyperactive.   All other systems reviewed and are negative.   There were no vitals taken for this visit.There is no height or weight on file to calculate BMI.  General Appearance: Casual and Neat  Eye Contact:  Good  Speech:  Clear and Coherent and Normal Rate  Volume:  Normal  Mood:  Euthymic  Affect:  Appropriate and Congruent  Thought Process:  Coherent and Goal Directed  Orientation:  Full (Time, Place, and Person)  Thought Content: WDL and Logical   Suicidal Thoughts:  No  Homicidal Thoughts:  No  Memory:  Immediate;   Good Recent;   Fair Remote;   Fair  Judgement:  Intact  Insight:  Present  Psychomotor Activity:  Normal  Concentration:  Concentration: Good and Attention Span: Good  Recall:  [employer] of Knowledge: Fair  Language: Good  Akathisia:  No  Handed:  Right  AIMS (if indicated): done  Assets:  Communication Skills Desire for Improvement Financial Resources/Insurance Housing Leisure Time Physical Health Social Support  ADL's:  Intact  Cognition: WNL  Sleep:  Good   Screenings: AIMS    Flowsheet Row Office Visit from 08/19/2022 in [facility]  AIMS Total Score 0      GAD-7    Flowsheet Row Clinical Support from 04/01/2022 in [facility] Clinical Support from 11/04/2021 in [facility] Office Visit from 10/21/2021 in [facility]  Total GAD-7 Score 4 8 7       PHQ2-9    Flowsheet Row Office Visit from 08/19/2022 in [facility] Clinical Support from 04/01/2022 in [facility] Clinical Support from 11/04/2021 in [facility] Office Visit from 10/21/2021 in [facility]  PHQ-2 Total Score 0 1 2 3   PHQ-9 Total Score -- -- 9 9      Flowsheet Row Office Visit from 08/19/2022 in [facility] Clinical Support from 04/01/2022 in [facility] Clinical Support from 11/04/2021 in [facility]  C-SSRS RISK CATEGORY No Risk No Risk No Risk      Assessment and Plan: Richelle Glick appears to be doing well.  She reports that medications are effective and managing her mental health without any adverse reaction.  States that she wants to switch to 2 month Abilify Asimtufii.  Informed will start at next visit.  During visit she is dressed appropriate for weather.  She is sitting upright in chair with no noted distress.  She is alert/oriented x 4, calm/cooperative and mood is congruent with affect.  She spoke in a clear tone at moderate volume, and normal pace, with good eye contact.  Her thought process is coherent and relevant; and there is no indication that she is currently responding to internal/external stimuli, or experiencing delusional thought content.  She denies depression, anxiety, suicidal/self-harm/homicidal ideation, psychosis, paranoia, and fluctuations in mood.  1. Schizophrenia, unspecified type (HCC)  CBC with Differential/Platelet    Comprehensive metabolic panel    Hemoglobin  A1c    Magnesium    TSH Prolactin Lipid panel   Collaboration of Care: Collaboration of Care: Medication Management AEB Medication refill, and adjustments Meds ordered this encounter  Medications   ARIPiprazole ER (ABILIFY ASIMTUFII) 720 MG/2.4ML PRSY    Sig: Inject 720 mg into the muscle every 2 (two) months.    Dispense:  2.4 mL    Refill:  2    Order Specific Question:   Supervising Provider    Answer:   Nelly Rout [[id number]]     Patient/Guardian was advised Release of Information must be obtained prior to any record release in order to collaborate their care with an outside provider. Patient/Guardian was advised if they have not already done so to contact the registration department to sign all necessary forms in order for [place] to  release information regarding their care.   Consent: Patient/Guardian gives verbal consent for treatment and assignment of benefits for services provided during this visit. Patient/Guardian expressed understanding and agreed to proceed.    Cayce Paschal, NP 08/19/2022, 2:25 PM

## 2022-08-19 NOTE — Addendum Note (Signed)
Addended by: Antony Salmon F on: 08/19/2022 01:45 PM   Modules accepted: Orders

## 2022-08-20 LAB — MAGNESIUM: Magnesium: 2.1 mg/dL (ref 1.6–2.3)

## 2022-08-20 LAB — CBC WITH DIFFERENTIAL/PLATELET
Basophils Absolute: 0 10*3/uL (ref 0.0–0.2)
Basos: 1 %
EOS (ABSOLUTE): 0.2 10*3/uL (ref 0.0–0.4)
Eos: 3 %
Hematocrit: 43.4 % (ref 34.0–46.6)
Hemoglobin: 14.2 g/dL (ref 11.1–15.9)
Immature Grans (Abs): 0 10*3/uL (ref 0.0–0.1)
Immature Granulocytes: 0 %
Lymphocytes Absolute: 3.2 10*3/uL — ABNORMAL HIGH (ref 0.7–3.1)
Lymphs: 45 %
MCH: 29.3 pg (ref 26.6–33.0)
MCHC: 32.7 g/dL (ref 31.5–35.7)
MCV: 90 fL (ref 79–97)
Monocytes Absolute: 0.4 10*3/uL (ref 0.1–0.9)
Monocytes: 6 %
Neutrophils Absolute: 3.2 10*3/uL (ref 1.4–7.0)
Neutrophils: 45 %
Platelets: 192 10*3/uL (ref 150–450)
RBC: 4.85 x10E6/uL (ref 3.77–5.28)
RDW: 12.3 % (ref 11.7–15.4)
WBC: 7.1 10*3/uL (ref 3.4–10.8)

## 2022-08-20 LAB — SPECIMEN STATUS REPORT

## 2022-08-20 LAB — COMPREHENSIVE METABOLIC PANEL
ALT: 10 IU/L (ref 0–32)
AST: 17 IU/L (ref 0–40)
Albumin/Globulin Ratio: 1.7 (ref 1.2–2.2)
Albumin: 4.7 g/dL (ref 3.9–4.9)
Alkaline Phosphatase: 99 IU/L (ref 44–121)
BUN/Creatinine Ratio: 13 (ref 12–28)
BUN: 12 mg/dL (ref 8–27)
Bilirubin Total: 0.3 mg/dL (ref 0.0–1.2)
CO2: 23 mmol/L (ref 20–29)
Calcium: 9.3 mg/dL (ref 8.7–10.3)
Chloride: 103 mmol/L (ref 96–106)
Creatinine, Ser: 0.96 mg/dL (ref 0.57–1.00)
Globulin, Total: 2.8 g/dL (ref 1.5–4.5)
Glucose: 91 mg/dL (ref 70–99)
Potassium: 4.9 mmol/L (ref 3.5–5.2)
Sodium: 139 mmol/L (ref 134–144)
Total Protein: 7.5 g/dL (ref 6.0–8.5)
eGFR: 66 mL/min/{1.73_m2} (ref >59)

## 2022-08-20 LAB — PROLACTIN: Prolactin: 4.3 ng/mL (ref 3.6–25.2)

## 2022-08-20 LAB — HEMOGLOBIN A1C
Est. average glucose Bld gHb Est-mCnc: 111 mg/dL
Hgb A1c MFr Bld: 5.5 % (ref 4.8–5.6)

## 2022-08-20 LAB — TSH: TSH: 0.655 u[IU]/mL (ref 0.450–4.500)

## 2022-09-23 ENCOUNTER — Other Ambulatory Visit (HOSPITAL_COMMUNITY): Payer: Self-pay | Admitting: Registered Nurse

## 2022-09-23 ENCOUNTER — Ambulatory Visit (INDEPENDENT_AMBULATORY_CARE_PROVIDER_SITE_OTHER): Payer: Medicaid Other | Admitting: *Deleted

## 2022-09-23 ENCOUNTER — Encounter (HOSPITAL_COMMUNITY): Payer: Self-pay

## 2022-09-23 VITALS — BP 129/68 | HR 67 | Temp 98.3°F | Resp 16 | Ht 62.0 in | Wt 153.4 lb

## 2022-09-23 DIAGNOSIS — F209 Schizophrenia, unspecified: Secondary | ICD-10-CM | POA: Diagnosis not present

## 2022-09-23 MED ORDER — ABILIFY ASIMTUFII 720 MG/2.4ML IM PRSY: 720.0000 mg | PREFILLED_SYRINGE | INTRAMUSCULAR | 2 refills | Status: DC

## 2022-09-23 MED ORDER — ARIPIPRAZOLE ER 720 MG/2.4ML IM PRSY
720.0000 mg | PREFILLED_SYRINGE | Freq: Once | INTRAMUSCULAR | Status: AC
  Administered 2022-09-23: 720 mg via INTRAMUSCULAR

## 2022-09-23 NOTE — Progress Notes (Signed)
Patient arrived for injections of Abilify Asimtufii 720mg . She arrived with her medication from her pharmacy. Denies SI/HI or AV hallucinations. Denies any side effects. Given in Left Upper Outer Quadrant. No issues or complaints. To return in 2 months.

## 2022-11-18 ENCOUNTER — Encounter (HOSPITAL_COMMUNITY): Payer: Self-pay

## 2022-11-18 ENCOUNTER — Ambulatory Visit (HOSPITAL_COMMUNITY): Payer: Medicaid Other

## 2022-11-18 ENCOUNTER — Ambulatory Visit (INDEPENDENT_AMBULATORY_CARE_PROVIDER_SITE_OTHER): Payer: Medicaid Other | Admitting: Psychiatry

## 2022-11-18 VITALS — BP 126/72 | HR 73 | Resp 16 | Ht 62.0 in | Wt 150.6 lb

## 2022-11-18 DIAGNOSIS — F209 Schizophrenia, unspecified: Secondary | ICD-10-CM | POA: Diagnosis not present

## 2022-11-18 MED ORDER — ABILIFY ASIMTUFII 720 MG/2.4ML IM PRSY: 720.0000 mg | PREFILLED_SYRINGE | INTRAMUSCULAR | 3 refills | Status: DC

## 2022-11-18 NOTE — Progress Notes (Unsigned)
Patient arrived for injection of Abilify Asimtufi 720mg . Arrived with a helper that states they forgot to pick up her prescription and thought we might have a sample. No samples were available. They will pick it up and return next week. While she was here she did meet with Dr. Ronne Binning to discuss waking up at night, getting less sleep than previous visits.

## 2022-11-18 NOTE — Progress Notes (Signed)
BH MD/PA/NP OP Progress Note  11/18/2022 4:50 PM Carol Russell  MRN:  [id number]  Chief Complaint: "Im doing okay" Carol Russell TSM provider "She is crossing boundaries"  HPI: 62 year old female seen today for follow-up psychiatric evaluation.  She has a psychiatric history of schizophrenia.  Currently she is managed on Abilify at a symptom 5 720 mg every 2 months.  She notes that her medications are effective in managing her psychiatric conditions.  Today she is well-groomed, pleasant, cooperative, and engaged in conversation.  She informed Probation officer that she is doing well.  She notes at times that she gets lonely.  Provider recommended patient using the [employer] program but patient notes that she is not interested at this time.  Patient was seen with her TSM (transitional management provider) Ms. Carol Russell who notes that recently Ms. Carol Russell has been calling her at 12:00 at night.  Ms. Carol Russell notes that she was concerned about upcoming appointments and wanted to discuss them.  Provider discussed with Ms. Carol Russell boundaries.  She endorsed understanding and notes that she would try not to cross them.    Today she notes her mood, anxiety, depression are well-managed.  Provider conducted a GAD-7 and patient scored a 2.  Provider also conducted PHQ-9 the patient scored a 3.  She endorsed adequate sleep and appetite.  Today she denies SI/HI/VAH, mania or paranoia.  No medication changes made today.  Patient agreeable to continue medications as prescribed.  No other concerns at this time.  Visit Diagnosis: No diagnosis found.  Past Psychiatric History: Schizophrenia  Past Medical History: No past medical history on file. No past surgical history on file.  Family Psychiatric History: Unknown  Family History: No family history on file.  Social History:  Social History   Socioeconomic History   Marital status: Single    Spouse name: Not on file   Number of children: Not on file   Years of  education: Not on file   Highest education level: Not on file  Occupational History   Not on file  Tobacco Use   Smoking status: Never   Smokeless tobacco: Never  Vaping Use   Vaping Use: Never used  Substance and Sexual Activity   Alcohol use: Not Currently   Drug use: Not Currently   Sexual activity: Not Currently  Other Topics Concern   Not on file  Social History Narrative   Not on file   Social Determinants of Health   Financial Resource Strain: Not on file  Food Insecurity: Not on file  Transportation Needs: Not on file  Physical Activity: Not on file  Stress: Not on file  Social Connections: Not on file    Allergies: No Known Allergies  Metabolic Disorder Labs: Lab Results  Component Value Date   HGBA1C 5.5 08/19/2022   Lab Results  Component Value Date   PROLACTIN 4.3 08/19/2022   No results found for: "CHOL", "TRIG", "HDL", "CHOLHDL", "VLDL", "LDLCALC" Lab Results  Component Value Date   TSH 0.655 08/19/2022    Therapeutic Level Labs: No results found for: "LITHIUM" No results found for: "VALPROATE" No results found for: "CBMZ"  Current Medications: Current Outpatient Medications  Medication Sig Dispense Refill   amoxicillin-clavulanate (AUGMENTIN) 875-125 MG tablet Take 1 tablet by mouth every 12 (twelve) hours. 14 tablet 0   ARIPiprazole ER (ABILIFY ASIMTUFII) 720 MG/2.4ML PRSY Inject 720 mg into the muscle every 2 (two) months. 2.4 mL 3   No current facility-administered medications for this visit.  Musculoskeletal: Strength & Muscle Tone: within normal limits Gait & Station: unsteady Patient leans: N/A  Psychiatric Specialty Exam: Review of Systems  There were no vitals taken for this visit.There is no height or weight on file to calculate BMI.  General Appearance: Well Groomed  Eye Contact:  Good  Speech:  Clear and Coherent and Normal Rate  Volume:  Normal  Mood:  Euthymic  Affect:  Appropriate and Congruent  Thought Process:   Coherent, Goal Directed, and Linear  Orientation:  Full (Time, Place, and Person)  Thought Content: WDL and Logical   Suicidal Thoughts:  No  Homicidal Thoughts:  No  Memory:  Immediate;   Good Recent;   Good Remote;   Good  Judgement:  Good  Insight:  Good  Psychomotor Activity:  Normal  Concentration:  Concentration: Good and Attention Span: Good  Recall:  Good  Fund of Knowledge: Good  Language: Good  Akathisia:  No  Handed:  Right  AIMS (if indicated): not done  Assets:  Communication Skills Desire for Improvement Financial Resources/Insurance Housing Leisure Time Physical Health Social Support  ADL's:  Intact  Cognition: WNL  Sleep:  Good   Screenings: [place] Office Visit from 11/18/2022 in [facility] Office Visit from 08/19/2022 in [place] Total Score 0 0      GAD-7    [place] Office Visit from 11/18/2022 in [place] from 04/01/2022 in [employer] from 11/04/2021 in [facility] Office Visit from 10/21/2021 in [facility]  Total GAD-7 Score 2 4 8 7       PHQ2-9    [place] Office Visit from 11/18/2022 in [facility] Office Visit from 08/19/2022 in [place] from 04/01/2022 in [place] from 11/04/2021 in [facility] Office Visit from 10/21/2021 in [facility]  PHQ-2 Total Score 0 0 1 2 3   PHQ-9 Total Score 3 -- -- 9 9      [place] Visit from 08/19/2022 in [place] from 04/01/2022 in [place] from 11/04/2021 in [place] No Risk No Risk No Risk        Assessment and Plan: Patient reports at times that she is lonely but reports that she is trying to cope with it.  At times she reaches out to her TSM coordinator late at night but reports that she will attempt to not do this anymore.  She finds the medications effective.  No medication changes made today.  Patient agreeable to continue medication as prescribed.  1. Schizophrenia, unspecified type ([place])  Continue- ARIPiprazole ER (ABILIFY ASIMTUFII) 720 MG/2.4ML PRSY; Inject 720 mg into the muscle every 2 (two) months.  Dispense: 2.4 mL; Refill: 3    Collaboration of Care: Collaboration of Care: Other provider involved in patient's care [place] [facility] staff and PCP   Patient/Guardian was advised Release of Information must be obtained prior to any record release in order to collaborate their care with an outside provider. Patient/Guardian was advised if they have not already done so to contact the registration department to sign all necessary forms in order for [place] to release information regarding their  care.   Consent: Patient/Guardian gives verbal consent for treatment and assignment of benefits for services provided during this visit. Patient/Guardian expressed understanding and agreed to proceed.   Follow up in 3 months for medication management Follow up with shot clinic staff Salley Slaughter, NP 11/18/2022, 4:50 PM

## 2022-11-23 ENCOUNTER — Ambulatory Visit (HOSPITAL_COMMUNITY): Payer: Medicaid Other

## 2022-11-23 ENCOUNTER — Encounter (HOSPITAL_COMMUNITY): Payer: Self-pay

## 2022-11-23 VITALS — BP 131/62 | HR 72 | Resp 16 | Ht 62.0 in | Wt 154.2 lb

## 2022-11-23 DIAGNOSIS — F209 Schizophrenia, unspecified: Secondary | ICD-10-CM

## 2022-11-23 MED ORDER — ARIPIPRAZOLE ER 720 MG/2.4ML IM PRSY
720.0000 mg | PREFILLED_SYRINGE | Freq: Once | INTRAMUSCULAR | Status: AC
  Administered 2022-11-23: 720 mg via INTRAMUSCULAR

## 2022-11-23 NOTE — Progress Notes (Signed)
Patient arrived with Abilify Asimtufii 720mg  to be given. Pleasant, cooperative, smiling with bright affect. Had a lot of questions about her bloodwork from December asking about being a prediabetic and what she can drink. Also asking if we could help her financially with her dental work that needs done. She said she will need $3000. Explained that we are unable to help with dental and explained the services we provide. Injection given in Right Upper Outer Quadrant without issues or complaints. Denies SI/HI or AV hallucinations.

## 2023-01-27 ENCOUNTER — Encounter (HOSPITAL_COMMUNITY): Payer: Self-pay

## 2023-01-27 ENCOUNTER — Ambulatory Visit (INDEPENDENT_AMBULATORY_CARE_PROVIDER_SITE_OTHER): Payer: Medicaid Other | Admitting: *Deleted

## 2023-01-27 VITALS — BP 127/78 | HR 63 | Resp 16 | Ht 62.0 in | Wt 148.6 lb

## 2023-01-27 DIAGNOSIS — F209 Schizophrenia, unspecified: Secondary | ICD-10-CM | POA: Diagnosis not present

## 2023-01-27 MED ORDER — ARIPIPRAZOLE ER 720 MG/2.4ML IM PRSY
720.0000 mg | PREFILLED_SYRINGE | Freq: Once | INTRAMUSCULAR | Status: AC
  Administered 2023-01-27: 720 mg via INTRAMUSCULAR

## 2023-01-27 NOTE — Progress Notes (Cosign Needed)
Patient arrived with her injection of Abilify Asimtufii 720mg . Given in Left upper Outer Quadrant without issues or complaint. Denies SI/HI or AV hallucinations. States the medication is doing well for her. Pleasant and smiling making small talk.

## 2023-02-17 ENCOUNTER — Ambulatory Visit (HOSPITAL_COMMUNITY): Payer: Medicaid Other

## 2023-03-31 ENCOUNTER — Encounter (HOSPITAL_COMMUNITY): Payer: Self-pay

## 2023-03-31 ENCOUNTER — Ambulatory Visit (INDEPENDENT_AMBULATORY_CARE_PROVIDER_SITE_OTHER): Payer: Medicaid Other | Admitting: Student

## 2023-03-31 ENCOUNTER — Ambulatory Visit (INDEPENDENT_AMBULATORY_CARE_PROVIDER_SITE_OTHER): Payer: MEDICAID | Admitting: *Deleted

## 2023-03-31 VITALS — BP 113/70 | HR 81 | Ht 62.0 in | Wt 148.2 lb

## 2023-03-31 DIAGNOSIS — Z79899 Other long term (current) drug therapy: Secondary | ICD-10-CM | POA: Diagnosis not present

## 2023-03-31 DIAGNOSIS — K5903 Drug induced constipation: Secondary | ICD-10-CM | POA: Diagnosis not present

## 2023-03-31 DIAGNOSIS — F209 Schizophrenia, unspecified: Secondary | ICD-10-CM | POA: Diagnosis not present

## 2023-03-31 MED ORDER — ARIPIPRAZOLE ER 720 MG/2.4ML IM PRSY
720.0000 mg | PREFILLED_SYRINGE | Freq: Once | INTRAMUSCULAR | Status: AC
  Administered 2023-03-31: 720 mg via INTRAMUSCULAR

## 2023-03-31 NOTE — Progress Notes (Signed)
BH MD/PA/NP OP Progress Note  03/31/2023 1:16 PM Carol Russell  MRN:  [id number]  Chief Complaint: "Im doing good"  HPI: 62 year old female seen today for follow-up psychiatric evaluation.  She has a psychiatric history of schizophrenia.  Currently she is managed on Abilify Asimtufii 720 mg every 2 months.  She notes that her medications are effective in managing her psychiatric conditions.  Today she is well-groomed, pleasant, cooperative, and engaged in conversation.  She informed Clinical research associate that she is doing well.  She has been Carol Russell notes that she was concerned about upcoming appointments and wanted to discuss them.  Provider discussed with Carol Russell boundaries.  She endorsed understanding and notes that she would try not to cross them.   Today she notes her mood, anxiety, depression are well-managed.  Provider conducted a GAD-7 and patient scored a 0.  Provider also conducted PHQ-2 the patient scored a 0.  She endorsed adequate sleep and appetite.  Today she denies SI/HI/VAH, mania or paranoia.  Her only somatic concern today is that she is not having regular BM, but she has not paid attention to the interval in which her BM are occurring.   No medication changes made today.  Patient agreeable to continue medications as prescribed.  No other concerns at this time.  Visit Diagnosis:    ICD-10-CM   1. Schizophrenia, unspecified type (HCC)  F20.9       Past Psychiatric History: Schizophrenia  Past Medical History: No past medical history on file. No past surgical history on file.  Family Psychiatric History: Unknown  Family History: No family history on file.  Social History:  Social History   Socioeconomic History   Marital status: Single    Spouse name: Not on file   Number of children: Not on file   Years of education: Not on file   Highest education level: Not on file  Occupational History   Not on file  Tobacco Use   Smoking status: Never   Smokeless tobacco: Never   Vaping Use   Vaping status: Never Used  Substance and Sexual Activity   Alcohol use: Not Currently   Drug use: Not Currently   Sexual activity: Not Currently  Other Topics Concern   Not on file  Social History Narrative   Not on file   Social Determinants of Health   Financial Resource Strain: Not on file  Food Insecurity: No Food Insecurity (01/01/2022)   Received from [facility]   Hunger Vital Sign    Worried About Running Out of Food in the Last Year: Never true    Ran Out of Food in the Last Year: Never true  Transportation Needs: Not on file  Physical Activity: Not on file  Stress: Not on file  Social Connections: Unknown (12/28/2021)   [facility]   Social Network    Social Network: Not on file    Allergies: No Known Allergies  Metabolic Disorder Labs: Lab Results  Component Value Date   HGBA1C 5.5 08/19/2022   Lab Results  Component Value Date   PROLACTIN 4.3 08/19/2022   No results found for: "CHOL", "TRIG", "HDL", "CHOLHDL", "VLDL", "LDLCALC" Lab Results  Component Value Date   TSH 0.655 08/19/2022    Therapeutic Level Labs: No results found for: "LITHIUM" No results found for: "VALPROATE" No results found for: "CBMZ"  Current Medications: Current Outpatient Medications  Medication Sig Dispense Refill   amoxicillin-clavulanate (AUGMENTIN) 875-125 MG tablet Take 1 tablet  by mouth every 12 (twelve) hours. 14 tablet 0   ARIPiprazole ER (ABILIFY ASIMTUFII) 720 MG/2.4ML PRSY Inject 720 mg into the muscle every 2 (two) months. 2.4 mL 3   No current facility-administered medications for this visit.     Musculoskeletal: Strength & Muscle Tone: within normal limits Gait & Station: unsteady Patient leans: N/A  Psychiatric Specialty Exam: Review of Systems  Constitutional:  Negative for activity change and appetite change.  Cardiovascular:  Negative for chest pain.  Gastrointestinal:  Positive for  constipation. Negative for abdominal pain and diarrhea.       Possible constipation?   Neurological:  Negative for headaches.    There were no vitals taken for this visit.There is no height or weight on file to calculate BMI.  General Appearance: Well Groomed  Eye Contact:  Good  Speech:  Clear and Coherent and Normal Rate  Volume:  Normal  Mood:  Euthymic  Affect:  Restricted  Thought Process:  Coherent, Goal Directed, and Linear  Orientation:  Full (Time, Place, and Person)  Thought Content: WDL and Logical   Suicidal Thoughts:  No  Homicidal Thoughts:  No  Memory:  Immediate;   Good Recent;   Good Remote;   Good  Judgement:  Good  Insight:  Good  Psychomotor Activity:  Normal  Concentration:  Concentration: Good and Attention Span: Good  Recall:  Good  Fund of Knowledge: Fair  Language: Good  Akathisia:  No  Handed:  Right  AIMS (if indicated): 0  Assets:  Communication Skills Desire for Improvement Financial Resources/Insurance Housing Leisure Time Physical Health Social Support  ADL's:  Intact  Cognition: Impaired,  Mild and Moderate, appears baseline  Sleep:  Good   Screenings: AIMS    Flowsheet Row Office Visit from 11/18/2022 in [facility] Office Visit from 08/19/2022 in [facility]  AIMS Total Score 0 0      GAD-7    Flowsheet Row Office Visit from 11/18/2022 in [facility] Clinical Support from 04/01/2022 in [facility] Clinical Support from 11/04/2021 in [facility] Office Visit from 10/21/2021 in [facility]  Total GAD-7 Score 2 4 8 7       PHQ2-9    Flowsheet Row Office Visit from 11/18/2022 in [facility] Office Visit from 08/19/2022 in [facility] Clinical Support from 04/01/2022 in [facility]  Clinical Support from 11/04/2021 in [facility] Office Visit from 10/21/2021 in [facility]  PHQ-2 Total Score 0 0 1 2 3   PHQ-9 Total Score 3 -- -- 9 9      Flowsheet Row Office Visit from 08/19/2022 in [facility] Clinical Support from 04/01/2022 in [facility] Clinical Support from 11/04/2021 in [facility]  C-SSRS RISK CATEGORY No Risk No Risk No Risk        Assessment and Plan: Patient reports that she has been finding enjoyment in shopping for her wants and needs. She feels safe overall, but is able to call on her neighbors or her church's prayer group if she ever feels unsafe. She is stable on current dose of medication at current interval.  She finds the medications effective.  No medication changes made today.  Patient agreeable to continue medication as prescribed. As patient is only seen every 4 months  in [facility] clinic with different providers, will arrange regular follow-up. Patient voices understanding and agreement.   1. Schizophrenia, unspecified type (HCC)  Continue- ARIPiprazole ER (ABILIFY ASIMTUFII) 720 MG/2.4ML PRSY; Inject 720 mg into the muscle every 2 (two) months.  Dispense: 2.4 mL; Refill: 3    Collaboration of Care: Collaboration of Care: Other provider involved in patient's care [facility] staff and PCP   Patient/Guardian was advised Release of Information must be obtained prior to any record release in order to collaborate their care with an outside provider. Patient/Guardian was advised if they have not already done so to contact the registration department to sign all necessary forms in order for [place] to release information regarding their care.   Consent: Patient/Guardian gives verbal consent for treatment and assignment of benefits for services provided during this visit. Patient/Guardian expressed understanding and agreed to  proceed.   Follow up in 2 months for medication management and with shot clinic staff  Lamar Sprinkles, MD 03/31/2023, 1:16 PM

## 2023-03-31 NOTE — Progress Notes (Cosign Needed)
Patient arrived with her Abilify Asimtufii 720mg  from her pharmacy. Was given in her Right Upper Outer Quadrant. No issues or complaints. Pleasant, smiling, cooperative. Denies any side effects.

## 2023-04-01 DIAGNOSIS — Z79899 Other long term (current) drug therapy: Secondary | ICD-10-CM | POA: Insufficient documentation

## 2023-04-01 DIAGNOSIS — K5903 Drug induced constipation: Secondary | ICD-10-CM | POA: Insufficient documentation

## 2023-05-11 ENCOUNTER — Telehealth (HOSPITAL_COMMUNITY): Payer: Self-pay | Admitting: Student

## 2023-05-18 ENCOUNTER — Telehealth (HOSPITAL_COMMUNITY): Payer: Self-pay | Admitting: Student

## 2023-05-26 ENCOUNTER — Encounter (HOSPITAL_COMMUNITY): Payer: Self-pay

## 2023-05-26 ENCOUNTER — Ambulatory Visit (INDEPENDENT_AMBULATORY_CARE_PROVIDER_SITE_OTHER): Payer: MEDICAID | Admitting: *Deleted

## 2023-05-26 VITALS — BP 124/72 | HR 65 | Resp 16 | Ht 62.0 in | Wt 150.2 lb

## 2023-05-26 DIAGNOSIS — F209 Schizophrenia, unspecified: Secondary | ICD-10-CM | POA: Diagnosis not present

## 2023-05-26 MED ORDER — ARIPIPRAZOLE ER 720 MG/2.4ML IM PRSY
720.0000 mg | PREFILLED_SYRINGE | Freq: Once | INTRAMUSCULAR | Status: AC
  Administered 2023-05-26: 720 mg via INTRAMUSCULAR

## 2023-05-26 NOTE — Progress Notes (Cosign Needed)
Patient arrived with her Abilify Asimtufii 720mg  from her pharmacy. Given in Left Upper Outer Quadrant without issue or complaint. Pleasant, smiling, and cooperative. Will return in 2 months.

## 2023-06-01 ENCOUNTER — Ambulatory Visit (HOSPITAL_COMMUNITY): Payer: MEDICAID

## 2023-06-02 ENCOUNTER — Ambulatory Visit (HOSPITAL_COMMUNITY): Payer: MEDICAID | Admitting: Student

## 2023-06-22 NOTE — Progress Notes (Deleted)
Carol Russell 55 yof Crossing boundaries Patient of [facility] clinic, sent for regular f/u to me by CC DX schiz, on Asimtufii 720 x2 mo C/f AP induced constipation Case manager [place] says she crosses boundaries  No Hx admits in EMR   SE? Why was 720 chosen? Sounds like 960 is the standard initial and maintenance Prev on maintena 8/3, switched 08/19/2022  Plan No EKG, needs EKG Needs labs PCP? Doesn't seem like it

## 2023-06-23 ENCOUNTER — Ambulatory Visit (HOSPITAL_COMMUNITY): Payer: MEDICAID | Admitting: Student

## 2023-07-21 ENCOUNTER — Encounter (HOSPITAL_COMMUNITY): Payer: Self-pay

## 2023-07-21 ENCOUNTER — Ambulatory Visit (HOSPITAL_COMMUNITY): Payer: MEDICAID | Admitting: *Deleted

## 2023-07-21 ENCOUNTER — Ambulatory Visit (INDEPENDENT_AMBULATORY_CARE_PROVIDER_SITE_OTHER): Payer: MEDICAID | Admitting: Student

## 2023-07-21 VITALS — BP 146/86 | HR 68 | Ht 62.0 in | Wt 147.8 lb

## 2023-07-21 DIAGNOSIS — K5903 Drug induced constipation: Secondary | ICD-10-CM

## 2023-07-21 DIAGNOSIS — F209 Schizophrenia, unspecified: Secondary | ICD-10-CM

## 2023-07-21 DIAGNOSIS — Z79899 Other long term (current) drug therapy: Secondary | ICD-10-CM

## 2023-07-21 MED ORDER — ARIPIPRAZOLE ER 720 MG/2.4ML IM PRSY
720.0000 mg | PREFILLED_SYRINGE | Freq: Once | INTRAMUSCULAR | Status: AC
  Administered 2023-07-21: 720 mg via INTRAMUSCULAR

## 2023-07-21 MED ORDER — POLYETHYLENE GLYCOL 3350 17 G PO PACK: 17.0000 g | PACK | Freq: Every day | ORAL | 0 refills | Status: AC

## 2023-07-21 NOTE — Progress Notes (Signed)
 Patient arrived with her injection of Abilify Asimtufii 720mg  from her pharmacy. States her medication is working fine with no issues or complaints. Had her 3 month check with the MD today during clinic. Pleasant, cooperative with flat affect that brightens on approach. Injection given in Right Upper Outer Quadrant. Denies SI/HI or AV hallucinations.

## 2023-07-22 ENCOUNTER — Encounter (HOSPITAL_COMMUNITY): Payer: Self-pay | Admitting: Student

## 2023-07-22 NOTE — Progress Notes (Signed)
BH MD/PA/NP OP Progress Note  07/22/2023 12:18 PM Carol Russell  MRN:  [id number]  Chief Complaint: "Im doing good"  HPI: 62 year old female seen today for follow-up psychiatric evaluation.  She has a psychiatric history of schizophrenia.  Currently she is managed on Abilify Asimtufii 720 mg every 2 months.  She notes that her medications are effective in managing her psychiatric conditions.  Today she is well-groomed, pleasant, cooperative, and engaged in conversation.  She informed Clinical research associate that she is doing well in regards to mood and denies feelings of anxiety. She remains amenable to seeing Dr. Jerrel Ivory as her primary psychiatrist. She denies SI/HI/AVH and has not had a psychotic episode for a few months. She does report some memory problems but states this is chronic.   She does report some concern for constipation today but was amenable to starting miralax daily to aid with her bowel movements.  Patient agreeable to continue medications as prescribed.  No other concerns at this time.  Visit Diagnosis:    ICD-10-CM   1. Schizophrenia, unspecified type (HCC)  F20.9     2. Long term current use of antipsychotic medication  Z79.899     3. Possible antipsychotic induced constipation  K59.03 polyethylene glycol (MIRALAX) 17 g packet      Past Psychiatric History: Schizophrenia  Past Medical History: No past medical history on file. No past surgical history on file.  Family Psychiatric History: Unknown  Family History: No family history on file.  Social History:  Social History   Socioeconomic History   Marital status: Single    Spouse name: Not on file   Number of children: Not on file   Years of education: Not on file   Highest education level: Not on file  Occupational History   Not on file  Tobacco Use   Smoking status: Never   Smokeless tobacco: Never  Vaping Use   Vaping status: Never Used  Substance and Sexual Activity   Alcohol use: Not Currently   Drug use:  Not Currently   Sexual activity: Not Currently  Other Topics Concern   Not on file  Social History Narrative   Not on file   Social Determinants of Health   Financial Resource Strain: Low Risk  (04/11/2023)   Received from [facility]   Overall Financial Resource Strain (CARDIA)    Difficulty of Paying Living Expenses: Not hard at all  Food Insecurity: No Food Insecurity (04/11/2023)   Received from [facility]   Hunger Vital Sign    Worried About Running Out of Food in the Last Year: Never true    Ran Out of Food in the Last Year: Never true  Transportation Needs: No Transportation Needs (04/11/2023)   [facility]    Lack of Transportation (Medical): No    Lack of Transportation (Non-Medical): No  Physical Activity: Not on file  Stress: Not on file  Social Connections: Unknown (12/28/2021)   Received from [facility], [facility]   Social Network    Social Network: Not on file    Allergies: No Known Allergies  Metabolic Disorder Labs: Lab Results  Component Value Date   HGBA1C 5.5 08/19/2022   Lab Results  Component Value Date   PROLACTIN 4.3 08/19/2022   No results found for: "CHOL", "TRIG", "HDL", "CHOLHDL", "VLDL", "LDLCALC" Lab Results  Component Value Date   TSH 0.655 08/19/2022    Therapeutic Level Labs: No results found for: "LITHIUM" No results found  for: "VALPROATE" No results found for: "CBMZ"  Current Medications: Current Outpatient Medications  Medication Sig Dispense Refill   polyethylene glycol (MIRALAX) 17 g packet Take 17 g by mouth daily. 30 packet 0   ARIPiprazole ER (ABILIFY ASIMTUFII) 720 MG/2.4ML PRSY Inject 720 mg into the muscle every 2 (two) months. 2.4 mL 3   No current facility-administered medications for this visit.     Musculoskeletal: Strength & Muscle Tone: within normal limits Gait & Station: unsteady Patient leans: N/A  Psychiatric Specialty Exam: Review of Systems   Constitutional:  Negative for activity change and appetite change.  Cardiovascular:  Negative for chest pain.  Gastrointestinal:  Positive for constipation. Negative for abdominal pain and diarrhea.       Possible constipation?   Neurological:  Negative for headaches.    There were no vitals taken for this visit.There is no height or weight on file to calculate BMI.  General Appearance: Well Groomed  Eye Contact:  Good  Speech:  Clear and Coherent and Normal Rate  Volume:  Normal  Mood:  Euthymic  Affect:  Restricted  Thought Process:  Coherent, Goal Directed, and Linear  Orientation:  Full (Time, Place, and Person)  Thought Content: WDL and Logical   Suicidal Thoughts:  No  Homicidal Thoughts:  No  Memory:  Immediate;   Good Recent;   Good Remote;   Good  Judgement:  Good  Insight:  Good  Psychomotor Activity:  Normal  Concentration:  Concentration: Good and Attention Span: Good  Recall:  Good  Fund of Knowledge: Fair  Language: Good  Akathisia:  No  Handed:  Right  AIMS (if indicated): 0  Assets:  Communication Skills Desire for Improvement Financial Resources/Insurance Housing Leisure Time Physical Health Social Support  ADL's:  Intact  Cognition: Impaired,  Mild and Moderate, appears baseline  Sleep:  Good   Screenings: AIMS    Flowsheet Row [facility] Office Visit from 08/19/2022 in [facility]  AIMS Total Score 0 0      GAD-7    Flowsheet Row Office Visit from 11/18/2022 in [facility] Clinical Support from 04/01/2022 in [facility] Clinical Support from 11/04/2021 in [facility] Office Visit from 10/21/2021 in [facility]  Total GAD-7 Score 2 4 8 7       PHQ2-9    Flowsheet Row Office Visit from 03/31/2023 in [facility]  Office Visit from 11/18/2022 in [facility] Office Visit from 08/19/2022 in [facility] Clinical Support from 04/01/2022 in [facility] Clinical Support from 11/04/2021 in [facility]  PHQ-2 Total Score 0 0 0 1 2  PHQ-9 Total Score -- 3 -- -- 9      Flowsheet Row Office Visit from 08/19/2022 in [facility] Clinical Support from 04/01/2022 in [facility] Clinical Support from 11/04/2021 in [facility]  C-SSRS RISK CATEGORY No Risk No Risk No Risk        Assessment and Plan: Patient reports that she has been finding enjoyment in shopping for her wants and needs. She feels safe overall, but is able to call on her neighbors or her church's prayer group if she ever feels unsafe. She is stable on current dose of medication at current interval.  She  finds the medications effective.  No medication changes made today.  Patient agreeable to continue medication as prescribed. As patient is only seen every 4 months in [facility] clinic with different providers, will arrange regular follow-up. Patient voices understanding and agreement.   1. Schizophrenia, unspecified type (HCC)  Continue- ARIPiprazole ER (ABILIFY ASIMTUFII) 720 MG/2.4ML PRSY; Inject 720 mg into the muscle every 2 (two) months.  Dispense: 2.4 mL; Refill: 3  2. Constipation -start miralax daily   Consent: Patient/Guardian gives verbal consent for treatment and assignment of benefits for services provided during this visit. Patient/Guardian expressed understanding and agreed to proceed.   Follow up in 2 months with shot clinic staff and December with Dr. Jerrel Ivory for medication management  Park Pope, MD 07/22/2023, 12:18 PM

## 2023-08-04 ENCOUNTER — Ambulatory Visit (HOSPITAL_COMMUNITY): Payer: MEDICAID | Admitting: Student

## 2023-08-04 DIAGNOSIS — Z79899 Other long term (current) drug therapy: Secondary | ICD-10-CM | POA: Diagnosis not present

## 2023-08-04 DIAGNOSIS — F209 Schizophrenia, unspecified: Secondary | ICD-10-CM | POA: Diagnosis not present

## 2023-08-04 MED ORDER — ABILIFY ASIMTUFII 720 MG/2.4ML IM PRSY: 720.0000 mg | PREFILLED_SYRINGE | INTRAMUSCULAR | 5 refills | Status: DC

## 2023-08-04 NOTE — Progress Notes (Signed)
BH MD Outpatient Progress Note  Date of visit: 08/04/2023 Carol Russell  MRN:  [id number]  Assessment:  Carol Russell presents for follow-up evaluation.  The patient has been established with the [facility] clinic for some time, for the management of her schizophrenia.  It appears that she has been doing well, based on the report of the patient and report of the patient's social worker (Librarian, academic at [employer]), [employer] [phone]).  The social worker does clarify that the patient has mild intellectual disability but does not have a legal guardian.  She lives independently, likely with significant assistance from her neighbors.  No medication changes for today, the patient will continue receiving Abilify Asimtufii in the [facility] clinic and follow-up with [place] every 3 months.  Plan for EKG with outside primary care provider.  Identifying Information: Carol Russell is a 62 y.o. y.o. female with a history of schizophrenia and mild intellectual disability who is an established patient with [facility] for management of schizophrenia.   Plan:  # Schizophrenia, mild intellectual disability Interventions: -- Continue Abilify Asimtufii 720 mg IM every 2 months, next injection due 1/16 - The patient has received her previous injections on time on 8/1, 9/26, and 11/21 - Continue MiraLAX as needed  #Long term use of antipsychotic medication -- Lipid panel not yet obtained, patient scheduled for lab appointment - A1c from 08/19/2022, 5.5 - Patient given paperwork to hand to primary care physician, to request EKG and fax it to this clinic - AIMS 0 on 08/04/2023   Patient was given contact information for behavioral health clinic and was instructed to call 911 for emergencies.   Subjective:  Chief Complaint:  Chief Complaint  Patient presents with   Follow-up    Interval History:  Because this is my first time meeting the patient, pertinent social history was collected.  The  patient has gaps in her autobiographical memory and has difficulty later talking about her psychiatric symptomatology.  This is consistent with the diagnosis of mild intellectual disability.  Based on her report it seems that the patient was born and raised in [place], finished high school, and may have done some college.  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  She receives a disability check.  The patient reports receiving transportation through [employer].    The patient is able to endorse a previous history of schizophrenia.  She has limited insight into the condition.  She only knows that she has had it for "a long time".  She reports previously experiencing auditory hallucinations but has not had any for a long time.  The patient reports experiencing concerns that people have bad intentions towards her.  She is unable to further elaborate despite prompting.  She denies experiencing ideas of reference or visual hallucinations.  She does not appear distressed.  She does not appear to be responding to internal stimuli.  She exhibits some speech latency but her responses are relatively logical.  The patient denies experiencing any depression or anxiety.  She reports eating on a regular basis.  She says that she enjoys preparing protein for herself.  She denies experiencing constipation or abdominal pain.  She is unable to state how often she has bowel movements.  The patient denies taking any medications other than her every other month injection.  She states that she has no medical problems.  Called the patient social worker at the number listed above.  She reports  that the patient has done better since starting Abilify Asimtufii many months ago.  She reports that the patient does live alone and takes care of her ADLs.  She confirms that the patient does not have a legal guardian, as reported by the patient.  Visit Diagnosis:    ICD-10-CM   1.  Schizophrenia, unspecified type (HCC)  F20.9 ARIPiprazole ER (ABILIFY ASIMTUFII) 720 MG/2.4ML PRSY    2. Long term current use of antipsychotic medication  Z79.899 Lipid Profile    HgB A1c      Past Psychiatric History:  Patient denies previous history of psychiatric admission.  There are no psychiatric admissions documented in the medical record.  The patient denies any history of self-harm, also congruent with my review of the chart.  Past Medical History: No past medical history on file. No past surgical history on file.  Family Psychiatric History: None pertinent  Family History: No family history on file.  Social History:  Social History   Socioeconomic History   Marital status: Single    Spouse name: Not on file   Number of children: Not on file   Years of education: Not on file   Highest education level: Not on file  Occupational History   Not on file  Tobacco Use   Smoking status: Never   Smokeless tobacco: Never  Vaping Use   Vaping status: Never Used  Substance and Sexual Activity   Alcohol use: Not Currently   Drug use: Not Currently   Sexual activity: Not Currently  Other Topics Concern   Not on file  Social History Narrative   Not on file   Social Determinants of Health   Financial Resource Strain: Low Risk  (04/11/2023)   Received [facility]   Overall Financial Resource Strain (CARDIA)    Difficulty of Paying Living Expenses: Not hard at all  Food Insecurity: No Food Insecurity (04/11/2023)   Received from [facility]   Hunger Vital Sign    Worried About Running Out of Food in the Last Year: Never true    Ran Out of Food in the Last Year: Never true  Transportation Needs: No Transportation Needs (04/11/2023)   Received from [facility]    Lack of Transportation (Medical): No    Lack of Transportation (Non-Medical): No  Physical Activity: Not on file  Stress: Not on file  Social Connections: Unknown (12/28/2021)    Received from [facility]   Social Network    Social Network: Not on file    Allergies: No Known Allergies  Current Medications: Current Outpatient Medications  Medication Sig Dispense Refill   [START ON 09/13/2023] ARIPiprazole ER (ABILIFY ASIMTUFII) 720 MG/2.4ML PRSY Inject 720 mg into the muscle every 2 (two) months. 2.4 mL 5   polyethylene glycol (MIRALAX) 17 g packet Take 17 g by mouth daily. 30 packet 0   No current facility-administered medications for this visit.     Objective:  Psychiatric Specialty Exam: Physical Exam Constitutional:      Appearance: the patient is not toxic-appearing.  Pulmonary:     Effort: Pulmonary effort is normal.  Neurological:     General: No focal deficit present.     Mental Status: the patient is alert and oriented to person, place, and time.   Review of Systems  Respiratory:  Negative for shortness of breath.   Cardiovascular:  Negative for chest pain.  Gastrointestinal:  Negative for abdominal pain, constipation, diarrhea, nausea and  vomiting.  Neurological:  Negative for headaches.      There were no vitals taken for this visit.  General Appearance: Fairly Groomed  Eye Contact:  Good  Speech: Some speech latency noted  Volume:  Normal  Mood: "Good"  Affect: Constricted  Thought Process:  Coherent  Orientation:  Full (Time, Place, and Person)  Thought Content: Logical   Suicidal Thoughts:  No  Homicidal Thoughts:  No  Memory: Poor  Judgement:  fair  Insight: limited  Psychomotor Activity:  Normal  Concentration:  Concentration: Good  Recall: Poor  Fund of Knowledge: Poor  Language: Poor  Akathisia:  No  Handed:    AIMS (if indicated): not done  Assets:  Communication Skills Desire for Improvement Financial Resources/Insurance Housing Leisure Time Physical Health  ADL's:  Intact  Cognition: Limited  Sleep:  Fair     Metabolic Disorder Labs: Lab Results  Component Value Date   HGBA1C 5.5  08/19/2022   Lab Results  Component Value Date   PROLACTIN 4.3 08/19/2022   No results found for: "CHOL", "TRIG", "HDL", "CHOLHDL", "VLDL", "LDLCALC" Lab Results  Component Value Date   TSH 0.655 08/19/2022    Therapeutic Level Labs: No results found for: "LITHIUM" No results found for: "VALPROATE" No results found for: "CBMZ"  Screenings: AIMS    Flowsheet Row Office Visit from 11/18/2022 in [facility] Office Visit from 08/19/2022 in [facility]  AIMS Total Score 0 0      GAD-7    Flowsheet Row Office Visit from 11/18/2022 in [facility] Clinical Support from 04/01/2022 in [facility] Clinical Support from 11/04/2021 in [facility] Office Visit from 10/21/2021 in [facility]  Total GAD-7 Score 2 4 8 7       PHQ2-9    Flowsheet Row Office Visit from 03/31/2023 in [facility] Office Visit from 11/18/2022 in [facility] Office Visit from 08/19/2022 in [facility] Clinical Support from 04/01/2022 in [facility] Clinical Support from 11/04/2021 in [facility]  PHQ-2 Total Score 0 0 0 1 2  PHQ-9 Total Score -- 3 -- -- 9      Flowsheet Row Office Visit from 08/19/2022 in [facility] Clinical Support from 04/01/2022 in [facility] Clinical Support from 11/04/2021 in [facility]  C-SSRS RISK CATEGORY No Risk No Risk No Risk       Collaboration of Care: none  A total of 30 minutes was spent involved in face to face clinical care, chart review, documentation.   Carlyn Reichert, MD 08/04/2023, 10:07 AM

## 2023-08-04 NOTE — Patient Instructions (Signed)
Hello, I am the psychiatric provider for this patient. They are in need of an EKG because they are on a long term dopamine blocking medication (Abilify Asimtufii). Our office cannot perform this test, so it would be helpful if your office could perform this test and fax results to the [facility] [phone] (attn Dr. Jerrel Ivory) or give the patient a copy to bring to [place].  All the best, Carlyn Reichert, MD

## 2023-08-07 MED ORDER — ARIPIPRAZOLE ER 720 MG/2.4ML IM PRSY
720.0000 mg | PREFILLED_SYRINGE | INTRAMUSCULAR | Status: AC
  Administered 2023-09-15 – 2024-09-06 (×6): 720 mg via INTRAMUSCULAR

## 2023-08-07 NOTE — Addendum Note (Signed)
Addended by: Theodoro Kos A on: 08/07/2023 11:14 AM   Modules accepted: Orders

## 2023-09-15 ENCOUNTER — Encounter (HOSPITAL_COMMUNITY): Payer: Self-pay

## 2023-09-15 ENCOUNTER — Ambulatory Visit (HOSPITAL_COMMUNITY): Payer: MEDICAID | Admitting: *Deleted

## 2023-09-15 VITALS — BP 119/73 | HR 68 | Resp 16 | Ht 62.0 in | Wt 151.6 lb

## 2023-09-15 DIAGNOSIS — F209 Schizophrenia, unspecified: Secondary | ICD-10-CM

## 2023-09-15 NOTE — Progress Notes (Cosign Needed)
Patient arrived for injection of Abilify Asimtufii 720mg  that she brought from her pharmacy. Pleasant, cooperative, smiling with an affect that brightens on approach. Denies SI/HI or AV hallucinations. States he had a wonderful holiday. Injection prepared as ordered and given in Left Upper Outer Quadrant without issues. Will return in 2 months.

## 2023-09-28 ENCOUNTER — Other Ambulatory Visit (HOSPITAL_COMMUNITY): Payer: MEDICAID

## 2023-10-19 ENCOUNTER — Other Ambulatory Visit (HOSPITAL_COMMUNITY): Payer: MEDICAID

## 2023-10-31 ENCOUNTER — Other Ambulatory Visit (INDEPENDENT_AMBULATORY_CARE_PROVIDER_SITE_OTHER): Payer: MEDICAID

## 2023-10-31 DIAGNOSIS — Z79899 Other long term (current) drug therapy: Secondary | ICD-10-CM

## 2023-10-31 NOTE — Progress Notes (Signed)
 Pt presents for lab draws which were done via one tiger top with no complaints, sent pt to [facility] to get CBC, A1c and other test done as veins rolled. Pt stated no complains.

## 2023-11-01 ENCOUNTER — Other Ambulatory Visit: Payer: Self-pay

## 2023-11-01 LAB — LIPID PANEL
Chol/HDL Ratio: 3.6 ratio (ref 0.0–4.4)
Cholesterol, Total: 215 mg/dL — ABNORMAL HIGH (ref 100–199)
HDL: 59 mg/dL (ref >39)
LDL Chol Calc (NIH): 135 mg/dL — ABNORMAL HIGH (ref 0–99)
Triglycerides: 116 mg/dL (ref 0–149)
VLDL Cholesterol Cal: 21 mg/dL (ref 5–40)

## 2023-11-01 LAB — HEMOGLOBIN A1C

## 2023-11-02 LAB — LIPID PANEL
Chol/HDL Ratio: 3.5 ratio (ref 0.0–4.4)
Cholesterol, Total: 216 mg/dL — ABNORMAL HIGH (ref 100–199)
HDL: 61 mg/dL (ref >39)
LDL Chol Calc (NIH): 140 mg/dL — ABNORMAL HIGH (ref 0–99)
Triglycerides: 84 mg/dL (ref 0–149)
VLDL Cholesterol Cal: 15 mg/dL (ref 5–40)

## 2023-11-02 LAB — HEMOGLOBIN A1C
Est. average glucose Bld gHb Est-mCnc: 114 mg/dL
Hgb A1c MFr Bld: 5.6 % (ref 4.8–5.6)

## 2023-11-10 ENCOUNTER — Ambulatory Visit (HOSPITAL_COMMUNITY): Payer: MEDICAID | Admitting: Student

## 2023-11-10 DIAGNOSIS — Z79899 Other long term (current) drug therapy: Secondary | ICD-10-CM | POA: Diagnosis not present

## 2023-11-10 DIAGNOSIS — F209 Schizophrenia, unspecified: Secondary | ICD-10-CM

## 2023-11-11 NOTE — Progress Notes (Addendum)
 BH MD Outpatient Progress Note  Date of visit: 11/10/2023 Carol Russell  MRN:  [id number]  Assessment:  Carol Russell presents for follow-up evaluation.  She is a longtime patient of the clinic who has maintained stability on Abilify LAI.  Today she appears similar to her last visit, with an unusual affect and some speech latency.  Her thought process is logical and she does not appear to be responding to internal stimuli.  Identifying Information: Carol Russell is a 63 y.o. y.o. female with a history of schizophrenia and mild intellectual disability who is an established patient with [facility] for management of schizophrenia.   Plan:  # Schizophrenia, mild intellectual disability Interventions: -- Continue Abilify Asimtufii 720 mg IM every 2 months - The patient has received her previous injections on time on 8/1, 9/26, and 11/21, 1/16 - Scheduled for next shot on 3/20 - Continue MiraLAX as needed  #Long term use of antipsychotic medication -- Lipid panel and A1c obtained 11/01/2023 - EKG performed with PCP but I'm unable to view this result, message sent to outside PCP - AIMS 0 on 08/04/2023   Patient was given contact information for behavioral health clinic and was instructed to call 911 for emergencies.   Subjective:  Chief Complaint:  Chief Complaint  Patient presents with   Follow-up    Interval History:  In addition to the information described above, the patient denies experiencing any auditory hallucinations or paranoia.  She reports that her mood has been euthymic and denies any hopelessness or suicidal thoughts.  She reports getting 8 hours of sleep per night and feels rested in the morning.  She denies any apparent medication side effects.  Visit Diagnosis:    ICD-10-CM   1. Schizophrenia, unspecified type (HCC)  F20.9     2. Long term current use of antipsychotic medication  Z79.899        Past Psychiatric History:  Patient denies  previous history of psychiatric admission.  There are no psychiatric admissions documented in the medical record.  The patient denies any history of self-harm, also congruent with my review of the chart.  Past Medical History: No past medical history on file. No past surgical history on file.  Family Psychiatric History: None pertinent  Family History: No family history on file.  Social History:  Social History   Socioeconomic History   Marital status: Single    Spouse name: Not on file   Number of children: Not on file   Years of education: Not on file   Highest education level: Not on file  Occupational History   Not on file  Tobacco Use   Smoking status: Never   Smokeless tobacco: Never  Vaping Use   Vaping status: Never Used  Substance and Sexual Activity   Alcohol use: Not Currently   Drug use: Not Currently   Sexual activity: Not Currently  Other Topics Concern   Not on file  Social History Narrative   Not on file   Social Drivers of Health   Financial Resource Strain: Low Risk  (04/11/2023)   [facility]   Overall Financial Resource Strain (CARDIA)    Difficulty of Paying Living Expenses: Not hard at all  Food Insecurity: No Food Insecurity (04/11/2023)   Received from [facility]   Hunger Vital Sign    Worried About Running Out of Food in the Last Year: Never true    Ran Out of Food in the Last Year: Never true  Transportation Needs: No Transportation Needs (04/11/2023)   Received from [facility] Transportation    Lack of Transportation (Medical): No    Lack of Transportation (Non-Medical): No  Physical Activity: Not on file  Stress: Not on file  Social Connections: Unknown (12/28/2021)   Received from [facility]   Social Network    Social Network: Not on file    Allergies: No Known Allergies  Current Medications: Current Outpatient Medications  Medication Sig Dispense Refill   ARIPiprazole ER (ABILIFY  ASIMTUFII) 720 MG/2.4ML PRSY Inject 720 mg into the muscle every 2 (two) months. 2.4 mL 5   Current Facility-Administered Medications  Medication Dose Route Frequency Provider Last Rate Last Admin   ARIPiprazole ER PRSY 720 mg  720 mg Intramuscular Q2 months    720 mg at 09/15/23 1353     Objective:  Psychiatric Specialty Exam: Physical Exam Constitutional:      Appearance: the patient is not toxic-appearing.  Pulmonary:     Effort: Pulmonary effort is normal.  Neurological:     General: No focal deficit present.     Mental Status: the patient is alert and oriented to person, place, and time.   Review of Systems  Respiratory:  Negative for shortness of breath.   Cardiovascular:  Negative for chest pain.  Gastrointestinal:  Negative for abdominal pain, constipation, diarrhea, nausea and vomiting.  Neurological:  Negative for headaches.      There were no vitals taken for this visit.  General Appearance: Fairly Groomed  Eye Contact:  Good  Speech: Some speech latency noted  Volume:  Normal  Mood: "Good"  Affect: Constricted  Thought Process:  Coherent  Orientation:  Full (Time, Place, and Person)  Thought Content: Logical   Suicidal Thoughts:  No  Homicidal Thoughts:  No  Memory: Poor  Judgement:  fair  Insight: limited  Psychomotor Activity:  Normal  Concentration:  Concentration: Good  Recall: Poor  Fund of Knowledge: Poor  Language: Poor  Akathisia:  No  Handed:    AIMS (if indicated): not done  Assets:  Communication Skills Desire for Improvement Financial Resources/Insurance Housing Leisure Time Physical Health  ADL's:  Intact  Cognition: Limited  Sleep:  Fair     Metabolic Disorder Labs: Lab Results  Component Value Date   HGBA1C 5.6 11/01/2023   Lab Results  Component Value Date   PROLACTIN 4.3 08/19/2022   Lab Results  Component Value Date   CHOL 216 (H) 11/01/2023   TRIG 84 11/01/2023   HDL 61 11/01/2023   CHOLHDL 3.5 11/01/2023    LDLCALC 140 (H) 11/01/2023   LDLCALC 135 (H) 10/31/2023   Lab Results  Component Value Date   TSH 0.655 08/19/2022    Therapeutic Level Labs: No results found for: "LITHIUM" No results found for: "VALPROATE" No results found for: "CBMZ"  Screenings: AIMS    Flowsheet Row Office Visit from 11/18/2022 in [facility] Office Visit from 08/19/2022 in [facility]  AIMS Total Score 0 0      GAD-7    Flowsheet Row Office Visit from 11/18/2022 in [facility] Clinical Support from 04/01/2022 in [facility] Clinical Support from 11/04/2021 in [facility] Office Visit from 10/21/2021 in [facility]  Total GAD-7 Score 2 4 8 7       PHQ2-9    Flowsheet Row Office Visit from 03/31/2023  in [facility] Office Visit from 11/18/2022 in [facility] Office Visit from 08/19/2022 in [facility] Clinical Support from 04/01/2022 in [facility] Clinical Support from 11/04/2021 in [facility]  PHQ-2 Total Score 0 0 0 1 2  PHQ-9 Total Score -- 3 -- -- 9      Flowsheet Row Office Visit from 08/19/2022 in [facility] Clinical Support from 04/01/2022 in [facility] Clinical Support from 11/04/2021 in [facility]  C-SSRS RISK CATEGORY No Risk No Risk No Risk       Collaboration of Care: none  A total of 30 minutes was spent involved in face to face clinical care, chart review, documentation.   Carlyn Reichert, MD 11/11/2023, 3:30 PM

## 2023-11-17 ENCOUNTER — Encounter (HOSPITAL_COMMUNITY): Payer: Self-pay

## 2023-11-17 ENCOUNTER — Ambulatory Visit (INDEPENDENT_AMBULATORY_CARE_PROVIDER_SITE_OTHER): Payer: MEDICAID | Admitting: *Deleted

## 2023-11-17 VITALS — BP 133/88 | HR 69 | Resp 16 | Ht 62.0 in | Wt 156.0 lb

## 2023-11-17 DIAGNOSIS — F209 Schizophrenia, unspecified: Secondary | ICD-10-CM

## 2023-11-17 NOTE — Progress Notes (Signed)
 Patient arrived for injection of Abilify Asimtufii 720mg  that she brought from her pharmacy. Patient smiling and in a very positive mood. She states that she had a small panic attack on Monday when her pharmacy did not have her medication available for pick up. She states she received it on Tuesday and was feeling happy after that. States that her medication works well and she was worried she would not get it. Denies SI/HI or AV hallucinations. No issues or complaints. Injection prepared as ordered and given in Right Upper Outer Quadrant. Will return in 2 months.

## 2024-01-04 ENCOUNTER — Encounter (HOSPITAL_BASED_OUTPATIENT_CLINIC_OR_DEPARTMENT_OTHER): Payer: Self-pay | Admitting: Emergency Medicine

## 2024-01-04 ENCOUNTER — Emergency Department (HOSPITAL_BASED_OUTPATIENT_CLINIC_OR_DEPARTMENT_OTHER)
Admission: EM | Admit: 2024-01-04 | Discharge: 2024-01-04 | Disposition: A | Discharge status: Home / Self Care | Payer: MEDICAID | Attending: Emergency Medicine | Admitting: Emergency Medicine

## 2024-01-04 ENCOUNTER — Other Ambulatory Visit: Payer: Self-pay

## 2024-01-04 DIAGNOSIS — R0981 Nasal congestion: Secondary | ICD-10-CM | POA: Diagnosis present

## 2024-01-04 DIAGNOSIS — J00 Acute nasopharyngitis [common cold]: Secondary | ICD-10-CM | POA: Diagnosis not present

## 2024-01-04 LAB — RESP PANEL BY RT-PCR (RSV, FLU A&B, COVID)  RVPGX2
Influenza A by PCR: NEGATIVE
Influenza B by PCR: NEGATIVE
Resp Syncytial Virus by PCR: NEGATIVE
SARS Coronavirus 2 by RT PCR: NEGATIVE

## 2024-01-04 MED ORDER — TYLENOL COLD MULTI-SYMPTOM DAY 5-10-200-325 MG/15ML PO LIQD: 30.0000 mL | Freq: Four times a day (QID) | ORAL | 0 refills | Status: AC

## 2024-01-04 NOTE — ED Triage Notes (Signed)
 Pt  c/o congestion, nasal drainage x 3 days. Denies fever. Pt requests liquid meds, difficulty swallowing pills

## 2024-01-04 NOTE — ED Provider Notes (Signed)
 [place] EMERGENCY DEPARTMENT AT [facility] Provider Note   CSN: [id number] Arrival date & time: 01/04/24  4098     History  Chief Complaint  Patient presents with   Nasal Congestion    Carol Russell is a 63 y.o. female.  HPI   Patient has a history of schizophrenia per her medical records.  Patient presents ED with complaints of nasal congestion.  Patient states she has had the symptoms for a few days.  She is finding it hard to breathe through her nose.  She denies any fevers.  No shortness of breath.  No sore throat  Home Medications Prior to Admission medications   Medication Sig Start Date End Date Taking? Authorizing Provider  Phenylephrine-DM-GG-APAP (TYLENOL COLD MULTI-SYMPTOM DAY) 5-10-200-325 MG/15ML LIQD Take 30 mLs by mouth every 6 (six) hours. 01/04/24  Yes Trish Furl, MD  ARIPiprazole  ER (ABILIFY  ASIMTUFII) 720 MG/2.4ML PRSY Inject 720 mg into the muscle every 2 (two) months. 09/13/23   Marilou Showman, MD      Allergies    Patient has no known allergies.    Review of Systems   Review of Systems  Physical Exam Updated Vital Signs BP 128/72 (BP Location: Right Arm)   Pulse 83   Temp 98.1 F (36.7 C)   Resp 14   LMP 04/04/2012   SpO2 98%  Physical Exam Vitals and nursing note reviewed.  Constitutional:      Appearance: She is well-developed. She is not diaphoretic.  HENT:     Head: Normocephalic and atraumatic.     Right Ear: Tympanic membrane and external ear normal.     Left Ear: Tympanic membrane and external ear normal.     Nose: Congestion and rhinorrhea present.  Eyes:     General: No scleral icterus.       Right eye: No discharge.        Left eye: No discharge.     Conjunctiva/sclera: Conjunctivae normal.  Neck:     Trachea: No tracheal deviation.  Cardiovascular:     Rate and Rhythm: Normal rate.  Pulmonary:     Effort: Pulmonary effort is normal. No respiratory distress.     Breath sounds: No stridor.  Abdominal:      General: There is no distension.  Musculoskeletal:        General: No swelling or deformity.     Cervical back: Neck supple.  Skin:    General: Skin is warm and dry.     Findings: No rash.  Neurological:     Mental Status: She is alert. Mental status is at baseline.     Cranial Nerves: No dysarthria or facial asymmetry.     Motor: No seizure activity.     ED Results / Procedures / Treatments   Labs (all labs ordered are listed, but only abnormal results are displayed) Labs Reviewed  RESP PANEL BY RT-PCR (RSV, FLU A&B, COVID)  [id number]    EKG None  Radiology No results found.  Procedures Procedures    Medications Ordered in ED Medications - No data to display  ED Course/ Medical Decision Making/ A&P Clinical Course as of 01/04/24 1558  Wed Jan 04, 2024  1118 COVID flu RSV test negative [JK]    Clinical Course User Index [JK] Trish Furl, MD                                 Medical Decision  Making Risk OTC drugs.   Patient presented with URI type symptoms.  She does have some evidence of nasal drainage.  Patient does not have any EMIS of COVID flu RSV on her respiratory testing.  Her lungs are clear doubt pneumonia.  She is not having a fever.  Doubt bacterial sinusitis.  Most likely viral syndrome.  Patient requested liquid medications.  Recommended Tylenol cold for symptomatic relief        Final Clinical Impression(s) / ED Diagnoses Final diagnoses:  Acute nasopharyngitis    Rx / DC Orders ED Discharge Orders          Ordered    Phenylephrine-DM-GG-APAP (TYLENOL COLD MULTI-SYMPTOM DAY) 5-10-200-325 MG/15ML LIQD  Every 6 hours        01/04/24 1029              Trish Furl, MD 01/04/24 1558

## 2024-01-04 NOTE — Discharge Instructions (Signed)
 The symptoms should resolve on their own over the next week but you can take the medication for your cold symptoms if you would like.  Follow-up with a primary care doctor to be rechecked if not improving by next week

## 2024-01-04 NOTE — ED Notes (Signed)

## 2024-01-12 ENCOUNTER — Ambulatory Visit (HOSPITAL_COMMUNITY): Payer: MEDICAID

## 2024-01-19 ENCOUNTER — Encounter (HOSPITAL_COMMUNITY): Payer: Self-pay

## 2024-01-19 ENCOUNTER — Ambulatory Visit (INDEPENDENT_AMBULATORY_CARE_PROVIDER_SITE_OTHER): Payer: MEDICAID | Admitting: *Deleted

## 2024-01-19 ENCOUNTER — Ambulatory Visit (INDEPENDENT_AMBULATORY_CARE_PROVIDER_SITE_OTHER): Payer: MEDICAID | Admitting: Psychiatry

## 2024-01-19 VITALS — BP 132/76 | HR 68 | Resp 16 | Ht 62.0 in | Wt 155.2 lb

## 2024-01-19 DIAGNOSIS — F209 Schizophrenia, unspecified: Secondary | ICD-10-CM | POA: Diagnosis not present

## 2024-01-19 MED ORDER — ABILIFY ASIMTUFII 720 MG/2.4ML IM PRSY: 720.0000 mg | PREFILLED_SYRINGE | INTRAMUSCULAR | 11 refills | Status: AC

## 2024-01-19 NOTE — Progress Notes (Signed)
 BH MD/PA/NP OP Progress Note  01/19/2024 4:14 PM Carol Russell  MRN:  [id number]  Chief Complaint: "Im doing okay"  HPI: 63 year old female seen today for follow-up psychiatric evaluation.  She has a psychiatric history of schizophrenia.  Currently she is managed on Abilify  Asimtufii 720 mg every 2 months.  She notes that her medications are effective in managing her psychiatric conditions.  Today she is well-groomed, pleasant, cooperative, and engaged in conversation.  She informed Clinical research associate that she is doing okay.  She reports that her mood is stable and notes that she has minimal anxiety and depression.  Today provider conducted a GAD-7 and patient scored a 3.  Provider also conducted PHQ-9  and patient scored a 3.  She endorses adequate sleep and appetite.  Today she denies SI/HI/AVH, mania, or paranoia.  Provider conducted an aims assessment today and patient scored a 0.  She denies having abnormal muscle movements.    No medication changes made today.  Patient agreeable to continue medications as prescribed.  No other concerns at this time.  Visit Diagnosis:    ICD-10-CM   1. Schizophrenia, unspecified type (HCC)  F20.9 ARIPiprazole  ER (ABILIFY  ASIMTUFII) 720 MG/2.4ML PRSY      Past Psychiatric History: Schizophrenia  Past Medical History: No past medical history on file.  Past Surgical History:  Procedure Laterality Date   TONSILLECTOMY      Family Psychiatric History: Unknown  Family History: No family history on file.  Social History:  Social History   Socioeconomic History   Marital status: Single    Spouse name: Not on file   Number of children: Not on file   Years of education: Not on file   Highest education level: Not on file  Occupational History   Not on file  Tobacco Use   Smoking status: Never   Smokeless tobacco: Never  Vaping Use   Vaping status: Never Used  Substance and Sexual Activity   Alcohol use: Not Currently   Drug use: Not Currently    Sexual activity: Not Currently  Other Topics Concern   Not on file  Social History Narrative   Not on file   Social Drivers of Health   Financial Resource Strain: Low Risk  (04/11/2023)   Received from [facility]   Overall Financial Resource Strain (CARDIA)    Difficulty of Paying Living Expenses: Not hard at all  Food Insecurity: No Food Insecurity (04/11/2023)   Received from [facility]   Hunger Vital Sign    Worried About Running Out of Food in the Last Year: Never true    Ran Out of Food in the Last Year: Never true  Transportation Needs: No Transportation Needs (04/11/2023)   Received [facility] - [facility]    Lack of Transportation (Medical): No    Lack of Transportation (Non-Medical): No  Physical Activity: Not on file  Stress: Not on file  Social Connections: Unknown (12/28/2021)   [facility]   Social Network    Social Network: Not on file    Allergies: No Known Allergies  Metabolic Disorder Labs: Lab Results  Component Value Date   HGBA1C 5.6 11/01/2023   Lab Results  Component Value Date   PROLACTIN 4.3 08/19/2022   Lab Results  Component Value Date   CHOL 216 (H) 11/01/2023   TRIG 84 11/01/2023   HDL 61 11/01/2023   CHOLHDL 3.5 11/01/2023   LDLCALC 140 (H) 11/01/2023   LDLCALC 135 (  H) 10/31/2023   Lab Results  Component Value Date   TSH 0.655 08/19/2022    Therapeutic Level Labs: No results found for: "LITHIUM" No results found for: "VALPROATE" No results found for: "CBMZ"  Current Medications: Current Outpatient Medications  Medication Sig Dispense Refill   ARIPiprazole  ER (ABILIFY  ASIMTUFII) 720 MG/2.4ML PRSY Inject 720 mg into the muscle every 2 (two) months. 2.4 mL 11   Phenylephrine-DM-GG-APAP (TYLENOL  COLD MULTI-SYMPTOM DAY) 5-10-200-325 MG/15ML LIQD Take 30 mLs by mouth every 6 (six) hours. 354 mL 0   Current Facility-Administered Medications  Medication Dose Route Frequency  Provider Last Rate Last Admin   ARIPiprazole  ER PRSY 720 mg  720 mg Intramuscular Q2 months    720 mg at 01/19/24 1423     Musculoskeletal: Strength & Muscle Tone: within normal limits Gait & Station: unsteady Patient leans: N/A  Psychiatric Specialty Exam: Review of Systems  Last menstrual period 04/04/2012.There is no height or weight on file to calculate BMI.  General Appearance: Well Groomed  Eye Contact:  Good  Speech:  Clear and Coherent and Normal Rate  Volume:  Normal  Mood:  Euthymic  Affect:  Appropriate and Congruent  Thought Process:  Coherent, Goal Directed, and Linear  Orientation:  Full (Time, Place, and Person)  Thought Content: WDL and Logical   Suicidal Thoughts:  No  Homicidal Thoughts:  No  Memory:  Immediate;   Good Recent;   Good Remote;   Good  Judgement:  Good  Insight:  Good  Psychomotor Activity:  Normal  Concentration:  Concentration: Good and Attention Span: Good  Recall:  Good  Fund of Knowledge: Good  Language: Good  Akathisia:  No  Handed:  Right  AIMS (if indicated):  done, WNL, 0  Assets:  Communication Skills Desire for Improvement Financial Resources/Insurance Housing Leisure Time Physical Health Social Support  ADL's:  Intact  Cognition: WNL  Sleep:  Good   Screenings: Geneticist, molecular Office Visit from 01/19/2024 in [facility] Office Visit from 11/18/2022 in [facility] Office Visit from 08/19/2022 in [facility]  AIMS Total Score 0 0 0      GAD-7    Flowsheet Row Office Visit from 01/19/2024 in [facility] Office Visit from 11/18/2022 in [facility] Clinical Support from 04/01/2022 in [facility] Clinical Support from 11/04/2021 in [facility] Office Visit from 10/21/2021 in [facility]  Total  GAD-7 Score 3 2 4 8 7       PHQ2-9    Flowsheet Row Office Visit from 01/19/2024 in [facility] Office Visit from 03/31/2023 in [facility] Office Visit from 11/18/2022 in [facility] Office Visit from 08/19/2022 in [facility] Clinical Support from 04/01/2022 in [facility]  PHQ-2 Total Score 1 0 0 0 1  PHQ-9 Total Score 3 -- 3 -- --      Flowsheet Row Office Visit from 01/19/2024 in [facility] ED from 01/04/2024 in [facility] Emergency Department at [facility] Office Visit from 08/19/2022 in [facility]  C-SSRS RISK CATEGORY No Risk No Risk No Risk        Assessment and Plan: Patient reports that her sleep, anxiety, depression, are well-managed.  She denies symptoms of psychosis.No medication changes  made today.  Patient agreeable to continue medications as prescribed.  1. Schizophrenia, unspecified type (HCC)  Continue- ARIPiprazole  ER (ABILIFY  ASIMTUFII) 720 MG/2.4ML PRSY; Inject 720 mg into the muscle every 2 (two) months.  Dispense: 2.4 mL; Refill: 3    Collaboration of Care: Collaboration of Care: Other provider involved in patient's care [facility] Shot clinic staff and PCP   Patient/Guardian was advised Release of Information must be obtained prior to any record release in order to collaborate their care with an outside provider. Patient/Guardian was advised if they have not already done so to contact the registration department to sign all necessary forms in order for us  to release information regarding their care.   Consent: Patient/Guardian gives verbal consent for treatment and assignment of benefits for services provided during this visit. Patient/Guardian expressed understanding and agreed to proceed.   Follow up in 2 months for medication management Follow up with shot clinic  staff Arlyne Bering, NP 01/19/2024, 4:14 PM

## 2024-01-19 NOTE — Progress Notes (Cosign Needed)
 Patient arrived for injection of Abilify  Asimtufii 720mg  that she brought from her pharmacy. States that she has no issues or complaints with her medication. No SI/HI or AV hallucinations. Pleasant, cooperative and smiling. Injections prepared as ordered and given in Left Upper Outer Quadrant. Will return in 2 months.

## 2024-01-25 ENCOUNTER — Encounter: Payer: Self-pay | Admitting: Podiatry

## 2024-01-25 ENCOUNTER — Ambulatory Visit (INDEPENDENT_AMBULATORY_CARE_PROVIDER_SITE_OTHER): Payer: MEDICAID | Admitting: Podiatry

## 2024-01-25 DIAGNOSIS — M79671 Pain in right foot: Secondary | ICD-10-CM | POA: Diagnosis not present

## 2024-01-25 DIAGNOSIS — M79672 Pain in left foot: Secondary | ICD-10-CM

## 2024-01-25 DIAGNOSIS — B351 Tinea unguium: Secondary | ICD-10-CM | POA: Diagnosis not present

## 2024-01-25 DIAGNOSIS — L600 Ingrowing nail: Secondary | ICD-10-CM

## 2024-01-25 NOTE — Progress Notes (Signed)
 Patient presents for evaluation and treatment of tenderness and some redness around nails feet.  Tenderness around toes with walking and wearing shoes.  Physical exam:  General appearance: Alert, pleasant, and in no acute distress.  Vascular: Pedal pulses: DP palpable bilaterally, PT nonpalpable bilaterally.  Moderate edema lower legs bilaterally  Neurological:  No paresthesias or burning noted  Dermatologic:  Nails thickened, disfigured, discolored 1-5 BL with subungual debris.  Redness and hypertrophic nail folds along nail folds bilaterally but no signs of drainage or infection.  Musculoskeletal:  Hammertoes 2 through 5 bilaterally   Diagnosis: 1. Painful onychomycotic nails 1 through 5 bilaterally. 2. Pain toes 1 through 5 bilaterally. 3.  Ingrown toe nails bilaterally  Plan: Debrided onychomycotic nails 1 through 5 bilaterally.  Return 3 months

## 2024-02-03 ENCOUNTER — Encounter (HOSPITAL_COMMUNITY): Payer: MEDICAID | Admitting: Student

## 2024-02-10 ENCOUNTER — Ambulatory Visit (INDEPENDENT_AMBULATORY_CARE_PROVIDER_SITE_OTHER): Payer: MEDICAID | Admitting: Student

## 2024-02-10 DIAGNOSIS — F209 Schizophrenia, unspecified: Secondary | ICD-10-CM

## 2024-02-10 DIAGNOSIS — Z79899 Other long term (current) drug therapy: Secondary | ICD-10-CM | POA: Diagnosis not present

## 2024-02-10 NOTE — Progress Notes (Signed)
 BH MD Outpatient Progress Note  Date of visit: 02/10/2024 Carol Russell  MRN:  [id number]  Assessment:  Carol Russell presents for follow-up evaluation. As with previous encounters for quite some time, the patient demonstrates stability, though has poor functioning overall, likely related to lifetime schizophrenia.  No medication changes planned for today.  I reviewed the chart and it appears she has received her LAI on time since her last appointment.  She does need an EKG but lab monitoring is up-to-date.  I sent a message to her primary care provider to see if they can help us  with this.  The patient will follow-up with a new resident physician on 9/11 due to routine resident transition.  This was discussed with the patient and all questions were answered.    Identifying Information: Carol Russell is a 63 y.o. y.o. female with a history of schizophrenia and mild intellectual disability who is an established patient with [facility] for management of schizophrenia.   Plan:  # Schizophrenia, mild intellectual disability Interventions: -- Continue Abilify  Asimtufii 720 mg IM every 2 months - The patient has received her previous injections on time - Scheduled for next shot - Continue MiraLAX  as needed  #Long term use of antipsychotic medication -- Lipid panel and A1c obtained 11/01/2023 - Messaged primary care physician to see if they can fax us  a new EKG or send on interpretation to us  - AIMS 0 on 08/04/2023   Patient was given contact information for behavioral health clinic and was instructed to call 911 for emergencies.   Subjective:  Chief Complaint:  Chief Complaint  Patient presents with   Schizophrenia    Interval History:  The patient reports she has been enjoying cooking chicken.  She says that there is been no significant events in her life since her last visit.  She denies experiencing any form of hallucinations.  Her thought content is not  delusional.  She denies experiencing paranoia.  She denies experiencing depression or thoughts of self-harm.  She is satisfied with her current medication regimen.  Visit Diagnosis:    ICD-10-CM   1. Schizophrenia, unspecified type (HCC)  F20.9     2. Long term current use of antipsychotic medication  Z79.899         Past Psychiatric History:  Patient denies previous history of psychiatric admission.  There are no psychiatric admissions documented in the medical record.  The patient denies any history of self-harm, also congruent with my review of the chart.  Past Medical History: No past medical history on file.  Past Surgical History:  Procedure Laterality Date   TONSILLECTOMY      Family Psychiatric History: None pertinent  Family History: No family history on file.  Social History:  Social History   Socioeconomic History   Marital status: Single    Spouse name: Not on file   Number of children: Not on file   Years of education: Not on file   Highest education level: Not on file  Occupational History   Not on file  Tobacco Use   Smoking status: Never   Smokeless tobacco: Never  Vaping Use   Vaping status: Never Used  Substance and Sexual Activity   Alcohol use: Not Currently   Drug use: Not Currently   Sexual activity: Not Currently  Other Topics Concern   Not on file  Social History Narrative   Not on file   Social Drivers of Health   Financial Resource Strain: Low Risk  (  04/11/2023)   Received from [facility]   Overall Financial Resource Strain (CARDIA)    Difficulty of Paying Living Expenses: Not hard at all  Food Insecurity: No Food Insecurity (04/11/2023)   Received from [facility]   Hunger Vital Sign    Within the past 12 months, you worried that your food would run out before you got the money to buy more.: Never true    Within the past 12 months, the food you bought just didn't last and you didn't have money to get more.: Never true   Transportation Needs: No Transportation Needs (04/11/2023)   Received from [facility] - Transportation    Lack of Transportation (Medical): No    Lack of Transportation (Non-Medical): No  Physical Activity: Not on file  Stress: Not on file  Social Connections: Unknown (12/28/2021)   Received from [facility]   Social Network    Social Network: Not on file    Allergies: No Known Allergies  Current Medications: Current Outpatient Medications  Medication Sig Dispense Refill   ARIPiprazole  ER (ABILIFY  ASIMTUFII) 720 MG/2.4ML PRSY Inject 720 mg into the muscle every 2 (two) months. 2.4 mL 11   Phenylephrine-DM-GG-APAP (TYLENOL  COLD MULTI-SYMPTOM DAY) 5-10-200-325 MG/15ML LIQD Take 30 mLs by mouth every 6 (six) hours. 354 mL 0   Current Facility-Administered Medications  Medication Dose Route Frequency Provider Last Rate Last Admin   ARIPiprazole  ER PRSY 720 mg  720 mg Intramuscular Q2 months    720 mg at 01/19/24 1423     Objective:  Psychiatric Specialty Exam: Physical Exam Constitutional:      Appearance: the patient is not toxic-appearing.  Pulmonary:     Effort: Pulmonary effort is normal.  Neurological:     General: No focal deficit present.     Mental Status: the patient is alert and oriented to person, place, and time.   Review of Systems  Respiratory:  Negative for shortness of breath.   Cardiovascular:  Negative for chest pain.  Gastrointestinal:  Negative for abdominal pain, constipation, diarrhea, nausea and vomiting.  Neurological:  Negative for headaches.      LMP 04/04/2012   General Appearance: Fairly Groomed  Eye Contact:  Good  Speech: Some speech latency noted  Volume:  Normal  Mood: Good  Affect: Constricted  Thought Process:  Coherent  Orientation:  Full (Time, Place, and Person)  Thought Content: Logical   Suicidal Thoughts:  No  Homicidal Thoughts:  No  Memory: Poor  Judgement:  fair  Insight: limited  Psychomotor Activity:   Normal  Concentration:  Concentration: Good  Recall: Poor  Fund of Knowledge: Poor  Language: Poor  Akathisia:  No  Handed:    AIMS (if indicated): not done  Assets:  Communication Skills Desire for Improvement Financial Resources/Insurance Housing Leisure Time Physical Health  ADL's:  Intact  Cognition: Limited  Sleep:  Fair     Metabolic Disorder Labs: Lab Results  Component Value Date   HGBA1C 5.6 11/01/2023   Lab Results  Component Value Date   PROLACTIN 4.3 08/19/2022   Lab Results  Component Value Date   CHOL 216 (H) 11/01/2023   TRIG 84 11/01/2023   HDL 61 11/01/2023   CHOLHDL 3.5 11/01/2023   LDLCALC 140 (H) 11/01/2023   LDLCALC 135 (H) 10/31/2023   Lab Results  Component Value Date   TSH 0.655 08/19/2022    Therapeutic Level Labs: No results found for: LITHIUM No results found for: VALPROATE No results  found for: CBMZ  Screenings: AIMS    Flowsheet Row Office Visit from 01/19/2024 in [facility] Office Visit from 11/18/2022 in [facility] Office Visit from 08/19/2022 in [facility]  AIMS Total Score 0 0 0   GAD-7    Flowsheet Row Office Visit from 01/19/2024 in [facility] Office Visit from 11/18/2022 in [facility] Clinical Support from 04/01/2022 in [facility] Clinical Support from 11/04/2021 in [facility] Office Visit from 10/21/2021 in [facility]  Total GAD-7 Score 3 2 4 8 7    PHQ2-9    Flowsheet Row Office Visit from 01/19/2024 in [facility] Office Visit from 03/31/2023 in [facility] Office Visit from 11/18/2022 in [facility] Office Visit from 08/19/2022 in [facility] Clinical Support from 04/01/2022  in [facility]  PHQ-2 Total Score 1 0 0 0 1  PHQ-9 Total Score 3 -- 3 -- --   Flowsheet Row Office Visit from 01/19/2024 in [facility] ED from 01/04/2024 in [facility] Emergency Department at [facility] Office Visit from 08/19/2022 in [facility]  C-SSRS RISK CATEGORY No Risk No Risk No Risk    Collaboration of Care: none  A total of 30 minutes was spent involved in face to face clinical care, chart review, documentation.   Marilou Showman, MD 02/10/2024, 3:03 PM

## 2024-02-14 ENCOUNTER — Other Ambulatory Visit (HOSPITAL_COMMUNITY): Payer: Self-pay | Admitting: Student

## 2024-02-14 NOTE — Progress Notes (Signed)
 Outside physician was able to verify recently completed EKG showed normal sinus rhythm with corrected QT interval of 390.

## 2024-03-15 ENCOUNTER — Ambulatory Visit (HOSPITAL_COMMUNITY): Payer: MEDICAID

## 2024-03-15 ENCOUNTER — Ambulatory Visit (INDEPENDENT_AMBULATORY_CARE_PROVIDER_SITE_OTHER): Payer: MEDICAID

## 2024-03-15 VITALS — BP 120/79 | HR 70 | Ht 62.0 in | Wt 145.0 lb

## 2024-03-15 DIAGNOSIS — F209 Schizophrenia, unspecified: Secondary | ICD-10-CM | POA: Diagnosis not present

## 2024-03-15 NOTE — Progress Notes (Cosign Needed)
 Pt presents today for injection of Abilify  Asimtufii given in right outer quadrant. Pt does state that she wants to be titrated up or get injections where she does not have to come so often. Pt was agreeable to still returning in 2 months. Patient denies all AVH, SI and HI. There was no provider to mention this too.   JNL,CMA

## 2024-04-27 ENCOUNTER — Ambulatory Visit: Payer: MEDICAID | Admitting: Podiatry

## 2024-05-07 NOTE — Progress Notes (Unsigned)
 BH MD Outpatient Progress Note  Date of visit: 02/10/2024 Carol Russell  MRN:  [id number]  Assessment:  Carol Russell presents for follow-up evaluation. The patient demonstrates continued psychiatric stability with current medication regimen.  No medication changes planned for today and informed patient that unfortunately there is no Abilify  injection that is longer than 2 months.  She has been receiving her LAI injections on time.  EKG completed on February 2025 but unable to see results.  No safety concerns noted by patient in the visit.  Patient appears to be independent in her ADLs and has support for her IADLs from [facility] Child psychotherapist.  AIMS exam completed today 0.  Identifying Information: Carol Russell is a 63 y.o. y.o. female with a history of schizophrenia and mild intellectual disability who is an established patient with [facility] for management of schizophrenia.   Plan:  # Schizophrenia, mild intellectual disability Interventions: -- Continue Abilify  Asimtufii 720 mg IM every 2 months (last dose 03/15/2024) --Next injection today - Has been receiving scheduled shots - Continue MiraLAX  as needed  #Long term use of antipsychotic medication -- Lipid panel and A1c obtained 11/01/2023 - AIMS 0 on 05/10/2024  Patient was given contact information for behavioral health clinic and was instructed to call 911 for emergencies.   Future Appointments  Date Time Provider Department Center  05/10/2024  2:00 PM [facility] None  05/23/2024  9:15 AM Carol Russell, DPM [facility]  08/09/2024  9:00 AM Carol Krabbe, MD [facility] None    Subjective:  Chief Complaint:  No chief complaint on file.  Interval History:  -03/15/2024 - presented for abilify  asimtufii injection --has injection scheduled for today.  Reports she feels the medications are working well, reports no issues. Denies current auditory or visual hallucinations. Reports no  issues with sleep or appetite. Reports no physical issues. Reports she takes baths herself, reports she cooks for herself (meats, vegetables). Reports that she has a service that helps with paying her bills. Reports Carol Russell helps with bringing her to appointments. Denies substance use. Reports overall good mood. Denies SI/HI. Play bingo at community center.  She asks about 58-month injections, discussed with her that Abilify  only has injections every 2 months.  She was in agreement with continuing with current medication regimen.  Visit Diagnosis:    ICD-10-CM   1. Schizophrenia, unspecified type (HCC) [F20.9]  F20.9     2. Long term current use of antipsychotic medication  Z79.899       Past Psychiatric History:  Patient denies previous history of psychiatric admission.  There are no psychiatric admissions documented in the medical record.  The patient denies any history of self-harm Denies access to guns   Past Medical History: No past medical history on file.  Past Surgical History:  Procedure Laterality Date   TONSILLECTOMY      Family Psychiatric History: Unknown  Family History: No family history on file.  Social History:  Social History   Socioeconomic History   Marital status: Single    Spouse name: Not on file   Number of children: Not on file   Years of education: Not on file   Highest education level: Not on file  Occupational History   Not on file  Tobacco Use   Smoking status: Never   Smokeless tobacco: Never  Vaping Use   Vaping status: Never Used  Substance and Sexual Activity   Alcohol use: Not Currently   Drug use: Not Currently  Sexual activity: Not Currently  Other Topics Concern   Not on file  Social History Narrative   Not on file   Social Drivers of Health   Financial Resource Strain: Low Risk  (04/11/2023)   Received from [facility]   Overall Financial Resource Strain (CARDIA)    Difficulty of Paying Living Expenses: Not hard at all  Food  Insecurity: No Food Insecurity (04/11/2023)   Received from [facility]   Hunger Vital Sign    Within the past 12 months, you worried that your food would run out before you got the money to buy more.: Never true    Within the past 12 months, the food you bought just didn't last and you didn't have money to get more.: Never true  Transportation Needs: No Transportation Needs (04/11/2023)   Received from [facility] - Transportation    Lack of Transportation (Medical): No    Lack of Transportation (Non-Medical): No  Physical Activity: Not on file  Stress: Not on file  Social Connections: Unknown (12/28/2021)   Received from [facility]   Social Network    Social Network: Not on file   Lives by herself, not in any relationship Reports she gets Medicare Previously worked as post office No children 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 2.4 mL 11   Phenylephrine-DM-GG-APAP (TYLENOL  COLD MULTI-SYMPTOM DAY) 5-10-200-325 MG/15ML LIQD Take 30 mLs by mouth every 6 (six) hours. 354 mL 0   Current Facility-Administered Medications  Medication Dose Route Frequency Provider Last Rate Last Admin   ARIPiprazole  ER PRSY 720 mg  720 mg Intramuscular Q2 months    720 mg at 03/15/24 1420     Objective:  Psychiatric Specialty Exam: Physical Exam Constitutional:      Appearance: the patient is not toxic-appearing.  Pulmonary:     Effort: Pulmonary effort is normal.  Neurological:     General: No focal deficit present.     Mental Status: the patient is alert and oriented to person, place, and time.   Review of Systems  Respiratory:  Negative for shortness of breath.   Cardiovascular:  Negative for chest pain.   Gastrointestinal:  Negative for abdominal pain, constipation, diarrhea, nausea and vomiting.  Neurological:  Negative for headaches.      LMP 04/04/2012   General Appearance: Fairly Groomed  Eye Contact:  Good  Speech: Some speech latency noted  Volume:  Normal  Mood: Good  Affect: Constricted  Thought Process:  Coherent  Orientation:  Full (Time, Place, and Person)  Thought Content: Logical   Suicidal Thoughts:  No  Homicidal Thoughts:  No  Memory: Poor  Judgement:  fair  Insight: limited  Psychomotor Activity:  Normal  Concentration:  Concentration: Good  Recall: Poor  Fund of Knowledge: Poor  Language: Poor  Akathisia:  No  Handed:    AIMS (if indicated): done  Assets:  Communication Skills Desire for Improvement Financial Resources/Insurance Housing Leisure Time Physical Health  ADL's:  Intact  Cognition: Limited  Sleep:  Fair     Metabolic Disorder Labs: Lab Results  Component Value Date   HGBA1C 5.6 11/01/2023   Lab Results  Component Value Date   PROLACTIN 4.3 08/19/2022   Lab Results  Component  Value Date   CHOL 216 (H) 11/01/2023   TRIG 84 11/01/2023   HDL 61 11/01/2023   CHOLHDL 3.5 11/01/2023   LDLCALC 140 (H) 11/01/2023   LDLCALC 135 (H) 10/31/2023   Lab Results  Component Value Date   TSH 0.655 08/19/2022    Therapeutic Level Labs: No results found for: LITHIUM No results found for: VALPROATE No results found for: CBMZ  Screenings: AIMS    Flowsheet Row Office Visit from 01/19/2024 in [facility] Office Visit from 11/18/2022 in [facility] Office Visit from 08/19/2022 in [facility]  AIMS Total Score 0 0 0   GAD-7    Flowsheet Row Office Visit from 01/19/2024 in [facility] Office Visit from 11/18/2022 in [facility] Clinical Support from 04/01/2022 in [facility] Clinical Support from 11/04/2021 in [facility] Office Visit from 10/21/2021 in [facility]  Total GAD-7 Score 3 2 4 8 7    PHQ2-9    Flowsheet Row Office Visit from 01/19/2024 in [facility] Office Visit from 03/31/2023 in [facility] Office Visit from 11/18/2022 in [facility] Office Visit from 08/19/2022 in [facility] Clinical Support from 04/01/2022 in [facility]  PHQ-2 Total Score 1 0 0 0 1  PHQ-9 Total Score 3 -- 3 -- --   Flowsheet Row Office Visit from 01/19/2024 in [facility] ED from 01/04/2024 in [facility] Emergency Department at [facility] Office Visit from 08/19/2022 in [facility]  C-SSRS RISK CATEGORY No Risk No Risk No Risk    Collaboration of Care: attending MD  A total of 30 minutes was spent involved in face to face clinical care, chart review, documentation.   Corean Minor, MD, PGY-3 05/10/2024, 1:38 PM

## 2024-05-10 ENCOUNTER — Encounter (HOSPITAL_COMMUNITY): Admitting: Psychiatry

## 2024-05-10 ENCOUNTER — Ambulatory Visit (INDEPENDENT_AMBULATORY_CARE_PROVIDER_SITE_OTHER)

## 2024-05-10 ENCOUNTER — Ambulatory Visit (HOSPITAL_COMMUNITY): Admitting: Psychiatry

## 2024-05-10 VITALS — BP 125/69 | HR 70 | Ht 62.0 in | Wt 148.0 lb

## 2024-05-10 DIAGNOSIS — F209 Schizophrenia, unspecified: Secondary | ICD-10-CM

## 2024-05-10 DIAGNOSIS — Z79899 Other long term (current) drug therapy: Secondary | ICD-10-CM

## 2024-05-10 NOTE — Progress Notes (Cosign Needed)
 Pt presents today for injection of Abilify  Asimtufii given in LEFT outer quadrant. Pt does state that she wants to be titrated up or get injections where she does not have to come so often. Pt was agreeable to still returning in 2 months. Patient denies all AVH, SI and HI. There was no provider to mention this too.    JNL,CMA

## 2024-05-23 ENCOUNTER — Ambulatory Visit (INDEPENDENT_AMBULATORY_CARE_PROVIDER_SITE_OTHER): Payer: MEDICAID | Admitting: Podiatry

## 2024-05-23 DIAGNOSIS — M79671 Pain in right foot: Secondary | ICD-10-CM | POA: Diagnosis not present

## 2024-05-23 DIAGNOSIS — B351 Tinea unguium: Secondary | ICD-10-CM | POA: Diagnosis not present

## 2024-05-23 DIAGNOSIS — M79672 Pain in left foot: Secondary | ICD-10-CM

## 2024-05-23 NOTE — Progress Notes (Signed)
 Patient presents for evaluation and treatment of tenderness and some redness around nails feet.  Tenderness around toes with walking and wearing shoes.  Physical exam:  General appearance: Alert, pleasant, and in no acute distress.  Vascular: Pedal pulses: DP 2/4 B/L, PT 0/4 B/L. Moderate edema lower legs bilaterally  Neu  Dermatologic:  Nails thickened, disfigured, discolored 1-5 BL with subungual debris.  Redness and hypertrophic nail folds along nail folds bilaterally but no signs of drainage or infection.  Musculoskeletal:     Diagnosis: 1. Painful onychomycotic nails 1 through 5 bilaterally. 2. Pain toes 1 through 5 bilaterally.  Plan: -Debrided onychomycotic nails 1 through 5 bilaterally.  Sharply debrided nails with nail clipper and reduced with a power bur.  Return 3 months RFC

## 2024-07-05 ENCOUNTER — Ambulatory Visit (INDEPENDENT_AMBULATORY_CARE_PROVIDER_SITE_OTHER): Admitting: *Deleted

## 2024-07-05 ENCOUNTER — Encounter (HOSPITAL_COMMUNITY): Payer: Self-pay

## 2024-07-05 VITALS — BP 117/72 | HR 68 | Resp 15 | Ht 62.0 in | Wt 150.2 lb

## 2024-07-05 DIAGNOSIS — F209 Schizophrenia, unspecified: Secondary | ICD-10-CM | POA: Diagnosis not present

## 2024-07-05 MED ORDER — ARIPIPRAZOLE ER 720 MG/2.4ML IM PRSY
720.0000 mg | PREFILLED_SYRINGE | Freq: Once | INTRAMUSCULAR | Status: AC
  Administered 2024-07-05: 720 mg via INTRAMUSCULAR

## 2024-07-05 NOTE — Progress Notes (Cosign Needed)
 Patient arrived with her injection of Abilify  Asimtufii 720mg  that she brought from her pharmacy. Came with her DSS worker [place]. She was a few days early for her injection. Spoke with Dr. Harl who gave a verbal order to give medication early. Patient cooperative, pleasant. Flat affect that brightens on approach. Injection prepared as ordered and given in Right Upper Outer Quadrant. Patient complained of having dizzy spells at home in October. Was seen at [facility], was told it was possible dehydration. Discussed the importance of drinking water as she states she has not had any today. Bottle of water was provided. No other issues or complaints.

## 2024-08-07 NOTE — Progress Notes (Unsigned)
 BH MD Outpatient Progress Note  Date of visit: 02/10/2024 Carol Russell  MRN:  [id number]  Assessment:  Del Pinal presents for follow-up evaluation. The patient demonstrates continued psychiatric stability with current medication regimen.  No medication changes planned for today and informed patient that unfortunately there is no Abilify  injection that is longer than 2 months.  She has been receiving her LAI injections on time.  EKG completed on February 2025 but unable to see results.  No safety concerns noted by patient in the visit.  Patient appears to be independent in her ADLs and has support for her IADLs from [facility] child psychotherapist.  AIMS exam completed today 0.  Identifying Information: Carol Russell is a 63 y.o. y.o. female with a history of schizophrenia and mild intellectual disability who is an established patient with [facility] for management of schizophrenia.   Plan:  # Schizophrenia, mild intellectual disability Interventions: -- Continue Abilify  Asimtufii 720 mg IM every 2 months (last dose 07/05/2024, next dose 09/06/2024) - Has been receiving scheduled shots - Continue MiraLAX  as needed  #Long term use of antipsychotic medication -- Lipid panel and A1c obtained 11/01/2023 - AIMS 0 on 05/10/2024  Patient was given contact information for behavioral health clinic and was instructed to call 911 for emergencies.   Future Appointments  Date Time Provider Department Center  08/09/2024  9:00 AM Graham Krabbe, MD [facility] None  09/06/2024  1:30 PM [facility] N[facility]SE [facility] None  09/26/2024  9:15 AM Christine Rush, DPM [facility]    Subjective:  Chief Complaint:  No chief complaint on file.  Interval History:  -03/15/2024 - presented for abilify  asimtufii injection --has injection scheduled for today.  Reports she feels the medications are working well, reports no issues. Denies current auditory or visual hallucinations. Reports no issues  with sleep or appetite. Reports no physical issues. Reports she takes baths herself, reports she cooks for herself (meats, vegetables). Reports that she has a service that helps with paying her bills. Reports [name] helps with bringing her to appointments. Denies substance use. Reports overall good mood. Denies SI/HI. Play bingo at community center.  She asks about 60-month injections, discussed with her that Abilify  only has injections every 2 months.  She was in agreement with continuing with current medication regimen.  Visit Diagnosis:  No diagnosis found.   Past Psychiatric History:  Patient denies previous history of psychiatric admission.  There are no psychiatric admissions documented in the medical record.  The patient denies any history of self-harm Denies access to guns   Past Medical History: No past medical history on file.  Past Surgical History:  Procedure Laterality Date   TONSILLECTOMY      Family Psychiatric History: Unknown  Family History: No family history on file.  Social History:  Social History   Socioeconomic History   Marital status: Single    Spouse name: Not on file   Number of children: Not on file   Years of education: Not on file   Highest education level: Not on file  Occupational History   Not on file  Tobacco Use   Smoking status: Never   Smokeless tobacco: Never  Vaping Use   Vaping status: Never Used  Substance and Sexual Activity   Alcohol use: Not Currently   Drug use: Not Currently   Sexual activity: Not Currently  Other Topics Concern   Not on file  Social History Narrative   Not on file   Social Drivers of  Health   Financial Resource Strain: Low Risk (06/13/2024)   Received from [facility]   Overall Financial Resource Strain (CARDIA)    How hard is it for you to pay for the very basics like food, housing, medical care, and heating?: Not hard at all  Food Insecurity: No Food Insecurity (06/13/2024)   Received from [facility]   Hunger Vital Sign    Within the past 12 months, you worried that your food would run out before you got the money to buy more.: Never true    Within the past 12 months, the food you bought just didn't last and you didn't have money to get more.: Never true  Transportation Needs: No Transportation Needs (06/13/2024)   Received from [facility] - Transportation    In the past 12 months, has lack of transportation kept you from meetings, work, or from getting things needed for daily living?: No    In the past 12 months, has lack of transportation kept you from medical appointments or from getting medications?: No  Physical Activity: Not on file  Stress: Not on file  Social Connections: Not on file   Lives by herself, not in any relationship Reports she gets Medicare Previously worked as post office No children 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 2.4 mL 11   Phenylephrine-DM-GG-APAP (TYLENOL  COLD MULTI-SYMPTOM DAY) 5-10-200-325 MG/15ML LIQD Take 30 mLs by mouth every 6 (six) hours. 354 mL 0   Current Facility-Administered Medications  Medication Dose Route Frequency Provider Last Rate Last Admin   ARIPiprazole  ER PRSY 720 mg  720 mg Intramuscular Q2 months    720 mg at 05/10/24 1347     Objective:  Psychiatric Specialty Exam: Physical Exam Constitutional:      Appearance: the patient is not toxic-appearing.  Pulmonary:     Effort: Pulmonary effort is normal.  Neurological:     General: No focal deficit present.     Mental Status: the patient is alert and oriented to person, place, and time.   Review of Systems  Respiratory:  Negative for shortness of breath.   Cardiovascular:  Negative for chest  pain.  Gastrointestinal:  Negative for abdominal pain, constipation, diarrhea, nausea and vomiting.  Neurological:  Negative for headaches.      LMP 04/04/2012   General Appearance: Fairly Groomed  Eye Contact:  Good  Speech: Some speech latency noted  Volume:  Normal  Mood: Good  Affect: Constricted  Thought Process:  Coherent  Orientation:  Full (Time, Place, and Person)  Thought Content: Logical   Suicidal Thoughts:  No  Homicidal Thoughts:  No  Memory: Poor  Judgement:  fair  Insight: limited  Psychomotor Activity:  Normal  Concentration:  Concentration: Good  Recall: Poor  Fund of Knowledge: Poor  Language: Poor  Akathisia:  No  Handed:    AIMS (if indicated): done  Assets:  Communication Skills Desire for Improvement Financial Resources/Insurance Housing Leisure Time Physical Health  ADL's:  Intact  Cognition: Limited  Sleep:  Fair     Metabolic Disorder Labs: Lab Results  Component Value Date   HGBA1C 5.6 11/01/2023   Lab Results  Component Value Date   PROLACTIN 4.3 08/19/2022   Lab Results  Component Value Date   CHOL 216 (H) 11/01/2023   TRIG 84 11/01/2023   HDL 61 11/01/2023   CHOLHDL 3.5 11/01/2023   LDLCALC 140 (H) 11/01/2023   LDLCALC 135 (H) 10/31/2023   Lab Results  Component Value Date   TSH 0.655 08/19/2022    Therapeutic Level Labs: No results found for: LITHIUM No results found for: VALPROATE No results found for: CBMZ  Screenings: AIMS    Flowsheet Row Office Visit from 01/19/2024 in [facility] Office Visit from 11/18/2022 in [facility] Office Visit from 08/19/2022 in [facility]  AIMS Total Score 0 0 0   GAD-7    Flowsheet Row Office Visit from 01/19/2024 in [facility] Office Visit from 11/18/2022 in [facility] Clinical Support from 04/01/2022 in [facility] Clinical Support from 11/04/2021 in [facility] Office Visit from 10/21/2021 in [facility]  Total GAD-7 Score 3 2 4 8 7    PHQ2-9    Flowsheet Row Office Visit from 01/19/2024 in [facility] Office Visit from 03/31/2023 in [facility] Office Visit from 11/18/2022 in [facility] Office Visit from 08/19/2022 in [facility] Clinical Support from 04/01/2022 in [facility]  PHQ-2 Total Score 1 0 0 0 1  PHQ-9 Total Score 3 -- 3 -- --   Flowsheet Row Office Visit from 01/19/2024 in [facility] ED from 01/04/2024 in [facility] Emergency Department at [facility] Office Visit from 08/19/2022 in [facility]  C-SSRS RISK CATEGORY No Risk No Risk No Risk    Collaboration of Care: attending MD  A total of 30 minutes was spent involved in face to face clinical care, chart review, documentation.   Corean Minor, MD, PGY-3 08/07/2024, 11:44 AM

## 2024-08-09 ENCOUNTER — Ambulatory Visit (INDEPENDENT_AMBULATORY_CARE_PROVIDER_SITE_OTHER): Admitting: Psychiatry

## 2024-08-09 DIAGNOSIS — Z79899 Other long term (current) drug therapy: Secondary | ICD-10-CM | POA: Diagnosis not present

## 2024-08-09 DIAGNOSIS — F209 Schizophrenia, unspecified: Secondary | ICD-10-CM

## 2024-08-21 ENCOUNTER — Ambulatory Visit: Admitting: Podiatry

## 2024-08-22 ENCOUNTER — Ambulatory Visit: Admitting: Podiatry

## 2024-09-06 ENCOUNTER — Encounter (HOSPITAL_COMMUNITY): Payer: Self-pay

## 2024-09-06 ENCOUNTER — Ambulatory Visit (INDEPENDENT_AMBULATORY_CARE_PROVIDER_SITE_OTHER)

## 2024-09-06 VITALS — BP 128/71 | HR 69 | Temp 98.0°F | Ht 62.0 in | Wt 157.2 lb

## 2024-09-06 DIAGNOSIS — F209 Schizophrenia, unspecified: Secondary | ICD-10-CM | POA: Diagnosis not present

## 2024-09-06 NOTE — Progress Notes (Cosign Needed)
 Patient presented to office for injection. Patient received injection in [facility]. Patient tolerated injection well. Patient denies SI/HI/AVH. No additional concerns noted. Patient will return in 2 months.

## 2024-09-26 ENCOUNTER — Ambulatory Visit: Admitting: Podiatry

## 2024-10-24 ENCOUNTER — Ambulatory Visit: Admitting: Podiatry

## 2024-11-01 ENCOUNTER — Encounter (HOSPITAL_COMMUNITY): Admitting: Psychiatry

## 2024-11-08 ENCOUNTER — Encounter (HOSPITAL_COMMUNITY): Admitting: Psychiatry

## 2024-11-08 ENCOUNTER — Ambulatory Visit (HOSPITAL_COMMUNITY)
